# Patient Record
Sex: Male | Born: 1935 | Race: White | Hispanic: No | Marital: Married | Smoking: Former smoker
Health system: Southern US, Community
[De-identification: ages and names within clinical notes are randomized; demographics above are authoritative.]

## PROBLEM LIST (undated history)

## (undated) DIAGNOSIS — E7800 Pure hypercholesterolemia, unspecified: Secondary | ICD-10-CM

## (undated) DIAGNOSIS — E039 Hypothyroidism, unspecified: Secondary | ICD-10-CM

## (undated) DIAGNOSIS — I2510 Atherosclerotic heart disease of native coronary artery without angina pectoris: Secondary | ICD-10-CM

## (undated) DIAGNOSIS — N400 Enlarged prostate without lower urinary tract symptoms: Secondary | ICD-10-CM

## (undated) DIAGNOSIS — I10 Essential (primary) hypertension: Secondary | ICD-10-CM

## (undated) HISTORY — PX: CORONARY ANGIOPLASTY: SHX604

## (undated) HISTORY — PX: CHOLECYSTECTOMY: SHX55

---

## 2004-01-02 ENCOUNTER — Ambulatory Visit: Payer: Self-pay | Admitting: Unknown Physician Specialty

## 2006-04-27 ENCOUNTER — Ambulatory Visit: Payer: Self-pay | Admitting: Internal Medicine

## 2006-05-24 ENCOUNTER — Ambulatory Visit: Payer: Self-pay | Admitting: Internal Medicine

## 2008-06-12 ENCOUNTER — Ambulatory Visit: Payer: Self-pay | Admitting: Internal Medicine

## 2008-09-03 ENCOUNTER — Ambulatory Visit: Payer: Self-pay | Admitting: Unknown Physician Specialty

## 2009-01-03 IMAGING — US SCREENING ULTRASOUND OF ABDOMINAL AORTA
1 series · 18 of 20 positions shown · non-contrast
Comparison: none

REASON FOR EXAM: bruit
COMMENTS:

PROCEDURE:     US  - US EXAM AAA SCREENING  - April 27, 2006  [DATE]
RESULT:     The aorta is well seen with no dilatation identified to suggest
aneurysm. Maximum AP dimension is 1.53 cm and transverse 1.22 cm.

[Series 1: screening ultrasound of abdominal aorta · 18 of 20 slices shown]
[im 1/20]
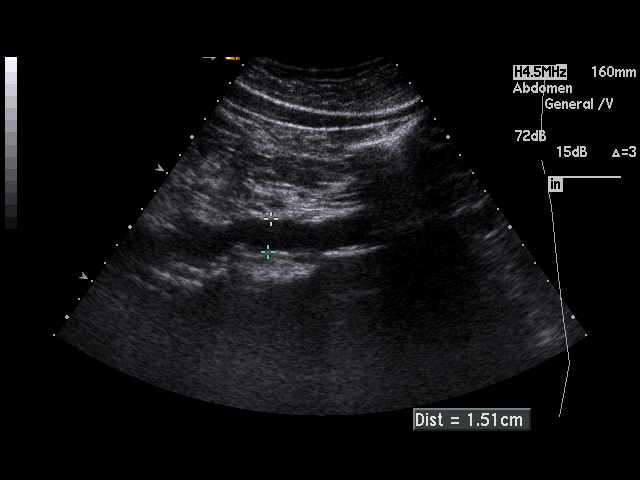
[im 2/20]
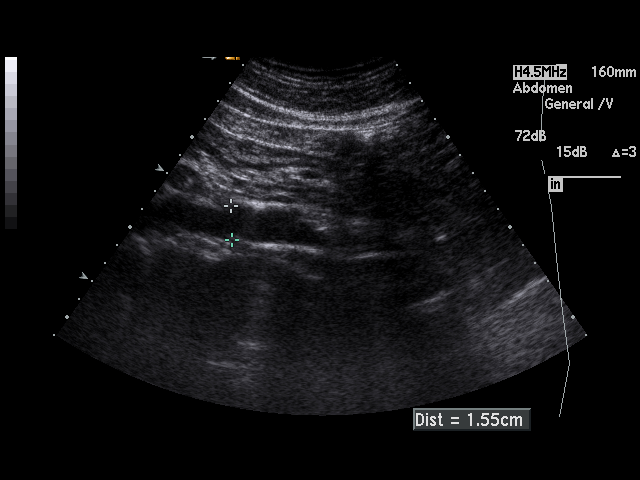
[im 3/20]
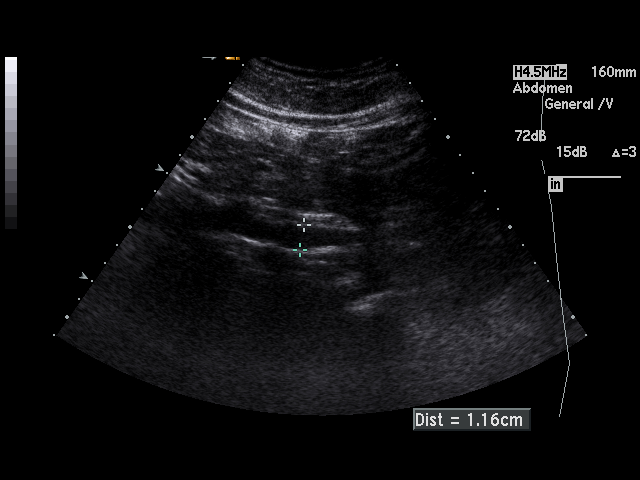
[im 4/20]
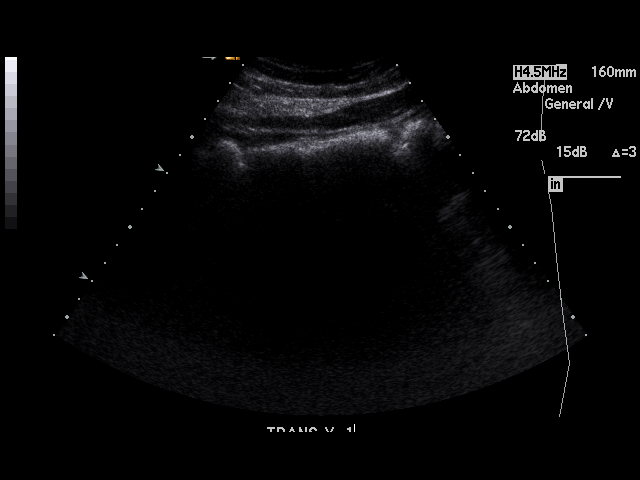
[im 6/20]
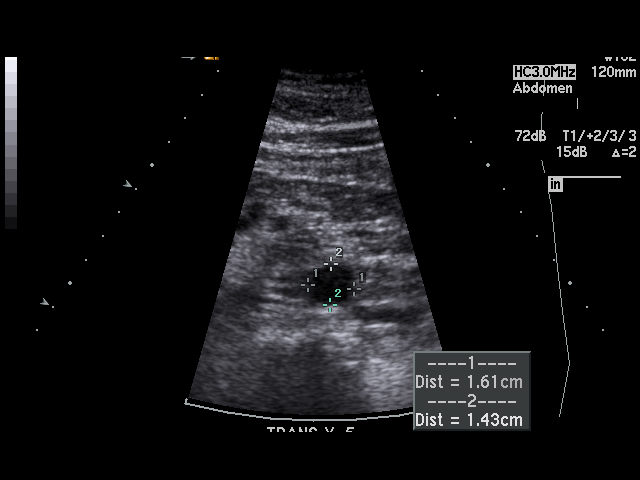
[im 7/20]
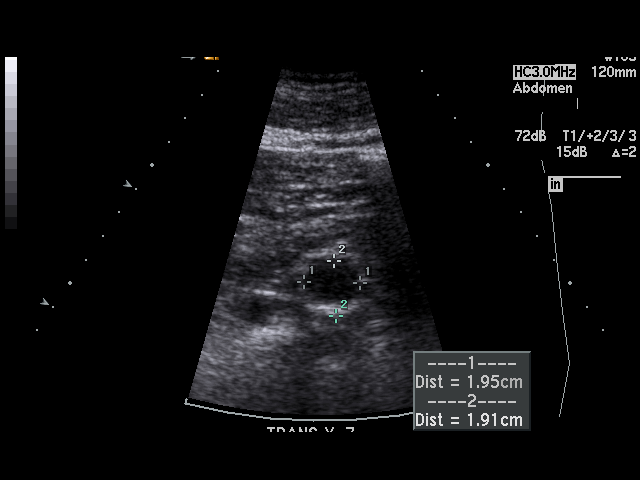
[im 8/20]
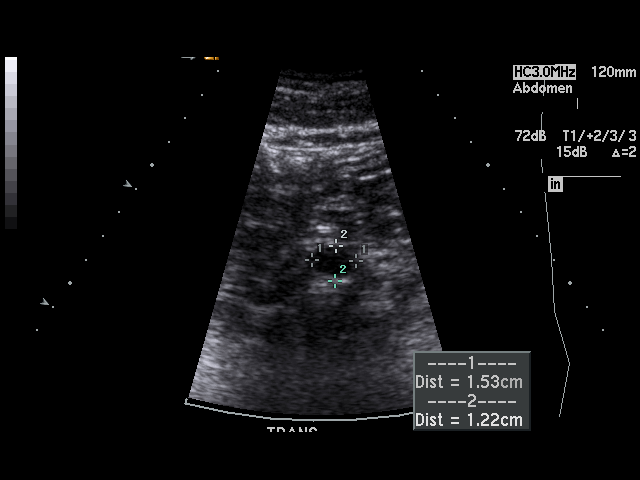
[im 9/20]
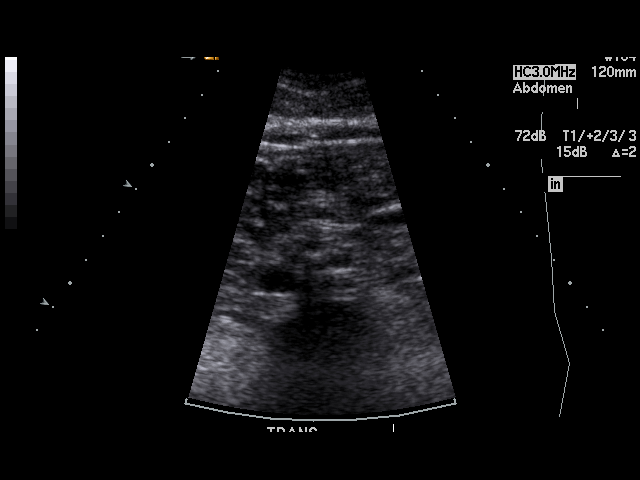
[im 10/20]
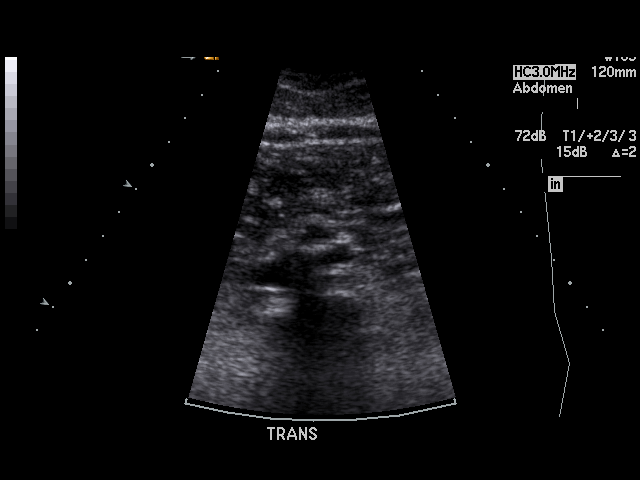
[im 11/20]
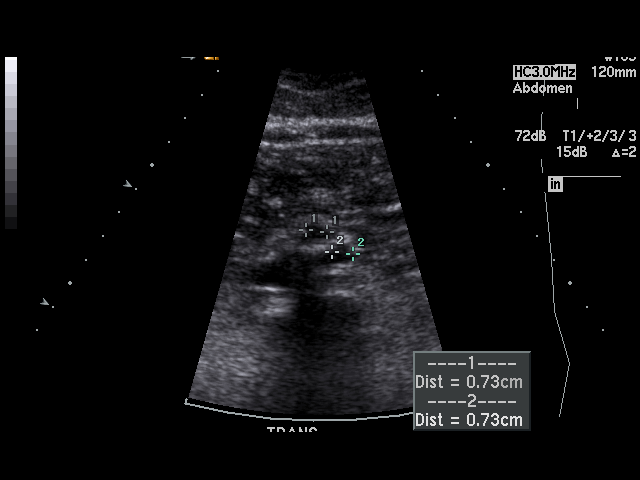
[im 12/20]
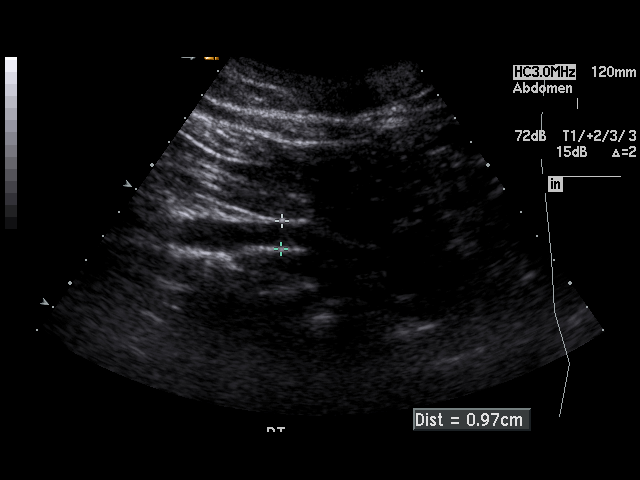
[im 13/20]
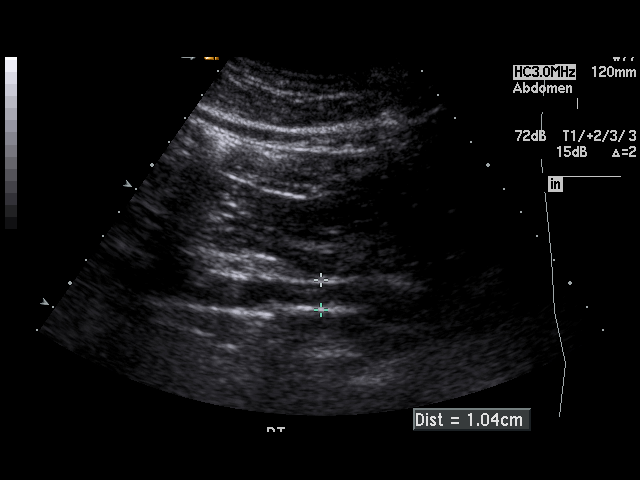
[im 14/20]
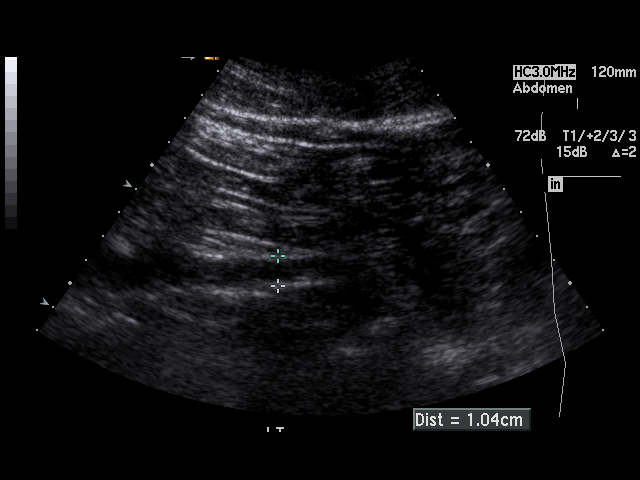
[im 16/20]
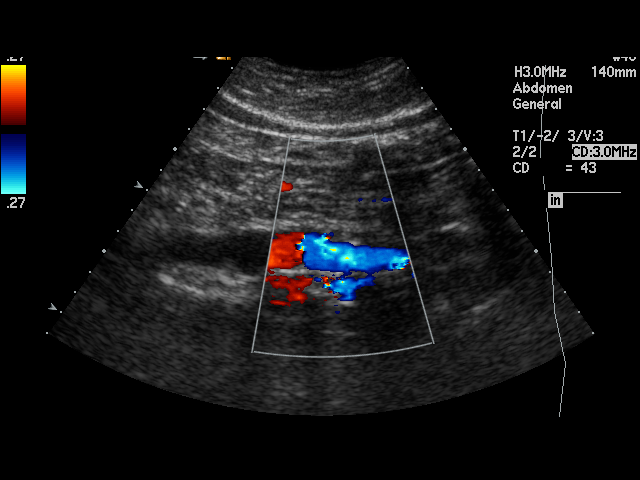
[im 17/20]
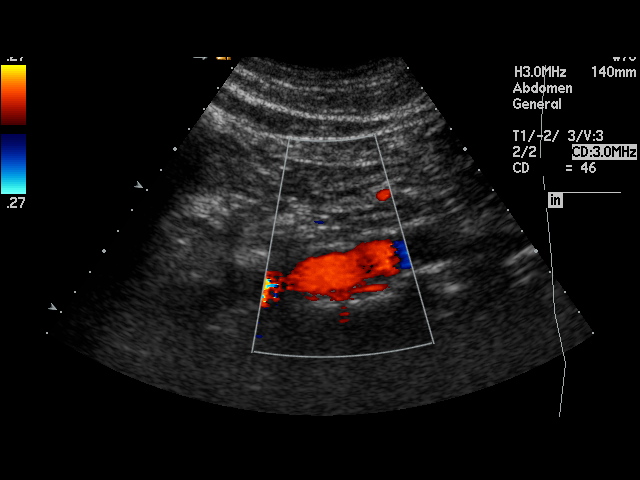
[im 18/20]
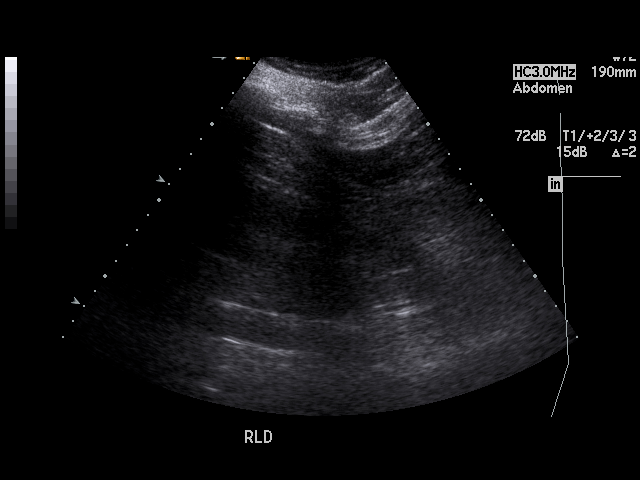
[im 19/20]
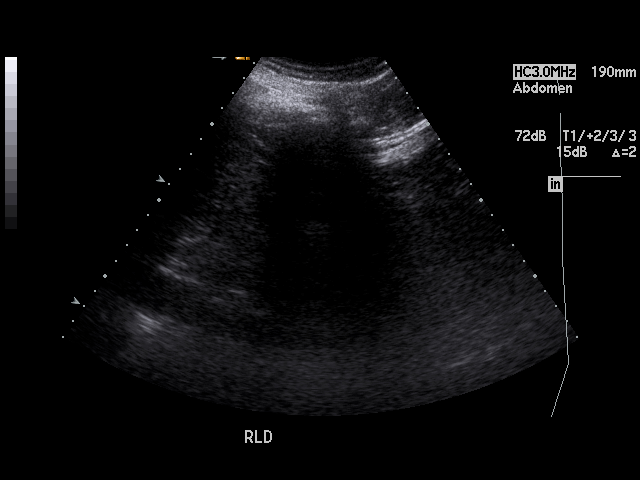
[im 20/20]
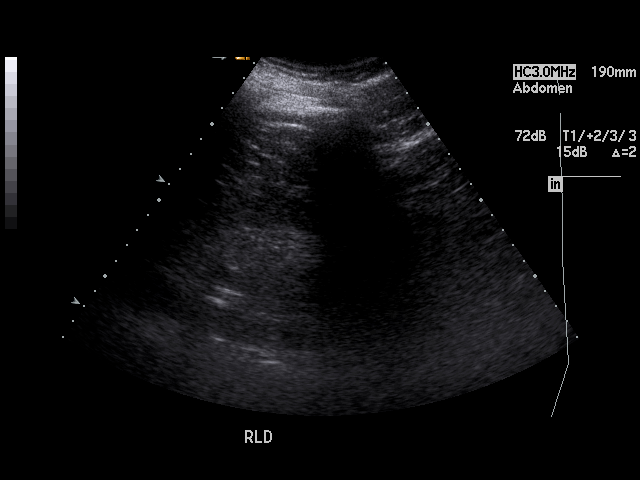

[18 of 20 positions shown; findings below may reference images not displayed]

IMPRESSION: 1)No abdominal aortic aneurysm is identified.

## 2012-01-10 ENCOUNTER — Ambulatory Visit: Payer: Self-pay | Admitting: Unknown Physician Specialty

## 2012-01-11 LAB — PATHOLOGY REPORT

## 2016-04-05 ENCOUNTER — Encounter: Payer: Self-pay | Admitting: *Deleted

## 2016-04-12 ENCOUNTER — Ambulatory Visit: Payer: Medicare Other | Admitting: Anesthesiology

## 2016-04-12 ENCOUNTER — Encounter: Payer: Self-pay | Admitting: *Deleted

## 2016-04-12 ENCOUNTER — Encounter
Admission: RE | Disposition: A | Discharge status: Home / Self Care | Payer: Self-pay | Source: Ambulatory Visit | Attending: Ophthalmology

## 2016-04-12 ENCOUNTER — Ambulatory Visit
Admission: RE | Admit: 2016-04-12 | Discharge: 2016-04-12 | Disposition: A | Discharge status: Home / Self Care | Payer: Medicare Other | Source: Ambulatory Visit | Attending: Ophthalmology | Admitting: Ophthalmology

## 2016-04-12 DIAGNOSIS — I10 Essential (primary) hypertension: Secondary | ICD-10-CM | POA: Insufficient documentation

## 2016-04-12 DIAGNOSIS — Z87891 Personal history of nicotine dependence: Secondary | ICD-10-CM | POA: Insufficient documentation

## 2016-04-12 DIAGNOSIS — Z955 Presence of coronary angioplasty implant and graft: Secondary | ICD-10-CM | POA: Diagnosis not present

## 2016-04-12 DIAGNOSIS — E039 Hypothyroidism, unspecified: Secondary | ICD-10-CM | POA: Diagnosis not present

## 2016-04-12 DIAGNOSIS — H2512 Age-related nuclear cataract, left eye: Secondary | ICD-10-CM | POA: Insufficient documentation

## 2016-04-12 DIAGNOSIS — I2510 Atherosclerotic heart disease of native coronary artery without angina pectoris: Secondary | ICD-10-CM | POA: Insufficient documentation

## 2016-04-12 DIAGNOSIS — Z79899 Other long term (current) drug therapy: Secondary | ICD-10-CM | POA: Insufficient documentation

## 2016-04-12 HISTORY — DX: Essential (primary) hypertension: I10

## 2016-04-12 HISTORY — DX: Benign prostatic hyperplasia without lower urinary tract symptoms: N40.0

## 2016-04-12 HISTORY — PX: CATARACT EXTRACTION W/PHACO: SHX586

## 2016-04-12 HISTORY — DX: Atherosclerotic heart disease of native coronary artery without angina pectoris: I25.10

## 2016-04-12 HISTORY — DX: Hypothyroidism, unspecified: E03.9

## 2016-04-12 SURGERY — PHACOEMULSIFICATION, CATARACT, WITH IOL INSERTION
Anesthesia: Monitor Anesthesia Care | Site: Eye | Laterality: Left | Wound class: Clean

## 2016-04-12 MED ORDER — FENTANYL CITRATE (PF) 100 MCG/2ML IJ SOLN
INTRAMUSCULAR | Status: DC | PRN
  Administered 2016-04-12: 25 ug via INTRAVENOUS
  Administered 2016-04-12 (×2): 12.5 ug via INTRAVENOUS

## 2016-04-12 MED ORDER — NA CHONDROIT SULF-NA HYALURON 40-17 MG/ML IO SOLN
INTRAOCULAR | Status: DC | PRN
  Administered 2016-04-12: 1 mL via INTRAOCULAR

## 2016-04-12 MED ORDER — MOXIFLOXACIN HCL 0.5 % OP SOLN
OPHTHALMIC | Status: AC
  Filled 2016-04-12: qty 3

## 2016-04-12 MED ORDER — MOXIFLOXACIN HCL 0.5 % OP SOLN
OPHTHALMIC | Status: DC | PRN
  Administered 2016-04-12: .2 mL via OPHTHALMIC

## 2016-04-12 MED ORDER — ARMC OPHTHALMIC DILATING DROPS
1.0000 "application " | OPHTHALMIC | Status: AC
  Administered 2016-04-12 (×3): 1 via OPHTHALMIC

## 2016-04-12 MED ORDER — POVIDONE-IODINE 5 % OP SOLN
OPHTHALMIC | Status: DC | PRN
  Administered 2016-04-12: 1 via OPHTHALMIC

## 2016-04-12 MED ORDER — ARMC OPHTHALMIC DILATING DROPS
OPHTHALMIC | Status: AC
  Filled 2016-04-12: qty 0.4

## 2016-04-12 MED ORDER — CARBACHOL 0.01 % IO SOLN
INTRAOCULAR | Status: DC | PRN
  Administered 2016-04-12: .5 mL via INTRAOCULAR

## 2016-04-12 MED ORDER — LIDOCAINE HCL (PF) 4 % IJ SOLN
INTRAOCULAR | Status: DC | PRN
  Administered 2016-04-12: 2 mL via OPHTHALMIC

## 2016-04-12 MED ORDER — EPINEPHRINE PF 1 MG/ML IJ SOLN
INTRAOCULAR | Status: DC | PRN
  Administered 2016-04-12: 1 mL via OPHTHALMIC

## 2016-04-12 MED ORDER — POVIDONE-IODINE 5 % OP SOLN
OPHTHALMIC | Status: AC
  Filled 2016-04-12: qty 30

## 2016-04-12 MED ORDER — MOXIFLOXACIN HCL 0.5 % OP SOLN: 1.0000 [drp] | OPHTHALMIC | Status: DC | PRN

## 2016-04-12 MED ORDER — FENTANYL CITRATE (PF) 100 MCG/2ML IJ SOLN
INTRAMUSCULAR | Status: AC
  Filled 2016-04-12: qty 2

## 2016-04-12 MED ORDER — EPINEPHRINE PF 1 MG/ML IJ SOLN
INTRAMUSCULAR | Status: AC
  Filled 2016-04-12: qty 2

## 2016-04-12 MED ORDER — SODIUM CHLORIDE 0.9 % IV SOLN
INTRAVENOUS | Status: DC
  Administered 2016-04-12: 11:00:00 via INTRAVENOUS

## 2016-04-12 SURGICAL SUPPLY — 23 items
CANNULA ANT/CHMB 27GA (MISCELLANEOUS) ×3 IMPLANT
CUP MEDICINE 2OZ PLAST GRAD ST (MISCELLANEOUS) ×3 IMPLANT
GLOVE BIO SURGEON STRL SZ8 (GLOVE) ×3 IMPLANT
GLOVE BIOGEL M 6.5 STRL (GLOVE) ×3 IMPLANT
GLOVE SURG LX 8.0 MICRO (GLOVE) ×2
GLOVE SURG LX STRL 8.0 MICRO (GLOVE) ×1 IMPLANT
GOWN STRL REUS W/ TWL LRG LVL3 (GOWN DISPOSABLE) ×2 IMPLANT
GOWN STRL REUS W/TWL LRG LVL3 (GOWN DISPOSABLE) ×4
LENS IOL ACRSF IQ TRC 9 22.5 ×1 IMPLANT
LENS IOL ACRYSOF IQ TORIC 22.5 ×2 IMPLANT
LENS IOL IQ TORIC 9 22.5 ×1 IMPLANT
PACK CATARACT (MISCELLANEOUS) ×3 IMPLANT
PACK CATARACT BRASINGTON LX (MISCELLANEOUS) ×3 IMPLANT
PACK EYE AFTER SURG (MISCELLANEOUS) ×3 IMPLANT
SOL BSS BAG (MISCELLANEOUS) ×3
SOL PREP PVP 2OZ (MISCELLANEOUS) ×3
SOLUTION BSS BAG (MISCELLANEOUS) ×1 IMPLANT
SOLUTION PREP PVP 2OZ (MISCELLANEOUS) ×1 IMPLANT
SYR 3ML LL SCALE MARK (SYRINGE) ×3 IMPLANT
SYR 5ML LL (SYRINGE) ×3 IMPLANT
SYR TB 1ML 27GX1/2 LL (SYRINGE) ×3 IMPLANT
WATER STERILE IRR 250ML POUR (IV SOLUTION) ×3 IMPLANT
WIPE NON LINTING 3.25X3.25 (MISCELLANEOUS) ×3 IMPLANT

## 2016-04-12 NOTE — Anesthesia Postprocedure Evaluation (Signed)
Anesthesia Post Note  Patient: Sergio Grimes  Procedure(s) Performed: Procedure(s) (LRB): CATARACT EXTRACTION PHACO AND INTRAOCULAR LENS PLACEMENT (IOC) (Left)  Patient location during evaluation: Short Stay Anesthesia Type: MAC Level of consciousness: awake, awake and alert and oriented Pain management: pain level controlled Vital Signs Assessment: post-procedure vital signs reviewed and stable Respiratory status: spontaneous breathing Cardiovascular status: stable Postop Assessment: no headache Anesthetic complications: no     Last Vitals:  Vitals:   04/12/16 1246 04/12/16 1254  BP: (!) 152/58 (!) 152/58  Pulse: (!) 53 (!) 53  Resp: 17 16  Temp: 36.2 C     Last Pain:  Vitals:   04/12/16 1104  TempSrc: Oral                 Lanora Manis

## 2016-04-12 NOTE — H&P (Signed)
All labs reviewed. Abnormal studies sent to patients PCP when indicated.  Previous H&P reviewed, patient examined, there are NO CHANGES.  Kathrynn Backstrom LOUIS4/10/201812:09 PM

## 2016-04-12 NOTE — Anesthesia Preprocedure Evaluation (Signed)
Anesthesia Evaluation  Patient identified by MRN, date of birth, ID band Patient awake    Reviewed: Allergy & Precautions, NPO status , Patient's Chart, lab work & pertinent test results  History of Anesthesia Complications Negative for: history of anesthetic complications  Airway Mallampati: III  TM Distance: >3 FB Neck ROM: Full    Dental no notable dental hx.    Pulmonary neg sleep apnea, neg COPD, former smoker,    breath sounds clear to auscultation- rhonchi (-) wheezing      Cardiovascular hypertension, Pt. on medications + CAD and + Cardiac Stents (2005)   Rhythm:Regular Rate:Normal - Systolic murmurs and - Diastolic murmurs    Neuro/Psych negative neurological ROS  negative psych ROS   GI/Hepatic negative GI ROS, Neg liver ROS,   Endo/Other  neg diabetesHypothyroidism   Renal/GU negative Renal ROS     Musculoskeletal negative musculoskeletal ROS (+)   Abdominal (+) - obese,   Peds  Hematology negative hematology ROS (+)   Anesthesia Other Findings Past Medical History: No date: BPH (benign prostatic hyperplasia) No date: Coronary artery disease No date: Hypertension No date: Hypothyroidism   Reproductive/Obstetrics                             Anesthesia Physical Anesthesia Plan  ASA: III  Anesthesia Plan: MAC   Post-op Pain Management:    Induction: Intravenous  Airway Management Planned: Natural Airway  Additional Equipment:   Intra-op Plan:   Post-operative Plan:   Informed Consent: I have reviewed the patients History and Physical, chart, labs and discussed the procedure including the risks, benefits and alternatives for the proposed anesthesia with the patient or authorized representative who has indicated his/her understanding and acceptance.     Plan Discussed with: CRNA and Anesthesiologist  Anesthesia Plan Comments:         Anesthesia Quick  Evaluation

## 2016-04-12 NOTE — Op Note (Signed)
PREOPERATIVE DIAGNOSIS:  Nuclear sclerotic cataract of the left eye.   POSTOPERATIVE DIAGNOSIS:  Nuclear sclerotic cataract of the left eye.   OPERATIVE PROCEDURE: Procedure(s): CATARACT EXTRACTION PHACO AND INTRAOCULAR LENS PLACEMENT (IOC)   SURGEON:  Galen Manila, MD.   ANESTHESIA: 1.      Managed anesthesia care. 2.     0.77ml os Shugarcaine was instilled following the paracentesis 2oranesstaff@   COMPLICATIONS:  None.   TECHNIQUE:   Stop and chop    DESCRIPTION OF PROCEDURE:  The patient was examined and consented in the preoperative holding area where the aforementioned topical anesthesia was applied to the left eye.  The patient was brought back to the Operating Room where he was sat upright on the gurney and given a target to fixate upon while the eye was marked at the 3:00 and 9:00 position.  The patient was then reclined on the operating table.  The eye was prepped and draped in the usual sterile ophthalmic fashion and a lid speculum was placed. A paracentesis was created with the side port blade and the anterior chamber was filled with viscoelastic. A near clear corneal incision was performed with the steel keratome. A continuous curvilinear capsulorrhexis was performed with a cystotome followed by the capsulorrhexis forceps. Hydrodissection and hydrodelineation were carried out with BSS on a blunt cannula. The lens was removed in a stop and chop technique and the remaining cortical material was removed with the irrigation-aspiration handpiece. The eye was inflated with viscoelastic and the ZCT lens was placed in the eye and rotated to within a few degrees of the predetermined orientation.  The remaining viscoelastic was removed from the eye.  The Sinskey hook was used to rotate the toric lens into its final resting place at 172 degrees.  0.1 ml of Vigamox was placed in the anterior chamber. The eye was inflated to a physiologic pressure and found to be watertight.  The eye was dressed  with Vigamox. The patient was given protective glasses to wear throughout the day and a shield with which to sleep tonight. The patient was also given drops with which to begin a drop regimen today and will follow-up with me in one day.  Implant Name Type Inv. Item Serial No. Manufacturer Lot No. LRB No. Used  LENS IOL TORIC 22.5 - [id number]   LENS IOL TORIC 22.5 [id number] ALCON   Left 1   Procedure(s) with comments: CATARACT EXTRACTION PHACO AND INTRAOCULAR LENS PLACEMENT (IOC) (Left) - [place] 02:46 AP% 15.7 CDE 26.07 Fluid pack lot # [id number]  Electronically signed: Gladyce Mcray LOUIS 4/10/201812:50 PM

## 2016-04-12 NOTE — Anesthesia Post-op Follow-up Note (Cosign Needed)
Anesthesia QCDR form completed.        

## 2016-04-12 NOTE — Discharge Instructions (Signed)
Eye Surgery Discharge Instructions  Expect mild scratchy sensation or mild soreness. DO NOT RUB YOUR EYE!  The day of surgery:  Minimal physical activity, but bed rest is not required  No reading, computer work, or close hand work  No bending, lifting, or straining.  May watch TV  For 24 hours:  No driving, legal decisions, or alcoholic beverages  Safety precautions  Eat anything you prefer: It is better to start with liquids, then soup then solid foods.  _____ Eye patch should be worn until postoperative exam tomorrow.  ____ Solar shield eyeglasses should be worn for comfort in the sunlight/patch while sleeping  Resume all regular medications including aspirin or Coumadin if these were discontinued prior to surgery. You may shower, bathe, shave, or wash your hair. Tylenol may be taken for mild discomfort.  Call your doctor if you experience significant pain, nausea, or vomiting, fever > 101 or other signs of infection. [phone] or [phone] Specific instructions:  Follow-up Information    PORFILIO,WILLIAM LOUIS, MD Follow up.   Specialty:  Ophthalmology Why:  April 11 at 10:25am Contact information: [address] [place] [address] [phone]

## 2016-04-12 NOTE — Transfer of Care (Signed)
Immediate Anesthesia Transfer of Care Note  Patient: Sergio Grimes  Procedure(s) Performed: Procedure(s) with comments: CATARACT EXTRACTION PHACO AND INTRAOCULAR LENS PLACEMENT (IOC) (Left) - [place] 02:46 AP% 15.7 CDE 26.07 Fluid pack lot # [id number]  Patient Location: PACU and Short Stay  Anesthesia Type:MAC  Level of Consciousness: awake, alert  and oriented  Airway & Oxygen Therapy: Patient Spontanous Breathing  Post-op Assessment: Report given to RN and Post -op Vital signs reviewed and stable  Post vital signs: Reviewed and stable  Last Vitals:  Vitals:   04/12/16 1104 04/12/16 1254  BP: (!) 136/58 (!) 152/58  Pulse: (!) 54 (!) 53  Resp: 18 16  Temp: 36.4 C     Last Pain:  Vitals:   04/12/16 1104  TempSrc: Oral         Complications: No apparent anesthesia complications

## 2016-04-26 ENCOUNTER — Encounter: Payer: Self-pay | Admitting: *Deleted

## 2016-05-03 ENCOUNTER — Encounter: Payer: Self-pay | Admitting: *Deleted

## 2016-05-03 ENCOUNTER — Ambulatory Visit: Payer: Medicare Other | Admitting: Anesthesiology

## 2016-05-03 ENCOUNTER — Encounter
Admission: RE | Disposition: A | Discharge status: Home / Self Care | Payer: Self-pay | Source: Ambulatory Visit | Attending: Ophthalmology

## 2016-05-03 ENCOUNTER — Ambulatory Visit
Admission: RE | Admit: 2016-05-03 | Discharge: 2016-05-03 | Disposition: A | Discharge status: Home / Self Care | Payer: Medicare Other | Source: Ambulatory Visit | Attending: Ophthalmology | Admitting: Ophthalmology

## 2016-05-03 DIAGNOSIS — I2510 Atherosclerotic heart disease of native coronary artery without angina pectoris: Secondary | ICD-10-CM | POA: Insufficient documentation

## 2016-05-03 DIAGNOSIS — H2511 Age-related nuclear cataract, right eye: Secondary | ICD-10-CM | POA: Diagnosis not present

## 2016-05-03 DIAGNOSIS — I10 Essential (primary) hypertension: Secondary | ICD-10-CM | POA: Insufficient documentation

## 2016-05-03 DIAGNOSIS — Z87891 Personal history of nicotine dependence: Secondary | ICD-10-CM | POA: Insufficient documentation

## 2016-05-03 DIAGNOSIS — Z955 Presence of coronary angioplasty implant and graft: Secondary | ICD-10-CM | POA: Diagnosis not present

## 2016-05-03 DIAGNOSIS — Z79899 Other long term (current) drug therapy: Secondary | ICD-10-CM | POA: Diagnosis not present

## 2016-05-03 DIAGNOSIS — E039 Hypothyroidism, unspecified: Secondary | ICD-10-CM | POA: Diagnosis not present

## 2016-05-03 HISTORY — PX: CATARACT EXTRACTION W/PHACO: SHX586

## 2016-05-03 SURGERY — PHACOEMULSIFICATION, CATARACT, WITH IOL INSERTION
Anesthesia: Monitor Anesthesia Care | Site: Eye | Laterality: Right | Wound class: Clean

## 2016-05-03 MED ORDER — MOXIFLOXACIN HCL 0.5 % OP SOLN
OPHTHALMIC | Status: DC | PRN
  Administered 2016-05-03: 0.2 mL via OPHTHALMIC

## 2016-05-03 MED ORDER — LIDOCAINE HCL (PF) 4 % IJ SOLN
INTRAMUSCULAR | Status: AC
  Filled 2016-05-03: qty 5

## 2016-05-03 MED ORDER — NA CHONDROIT SULF-NA HYALURON 40-17 MG/ML IO SOLN
INTRAOCULAR | Status: DC | PRN
  Administered 2016-05-03: 1 mL via INTRAOCULAR

## 2016-05-03 MED ORDER — NA CHONDROIT SULF-NA HYALURON 40-17 MG/ML IO SOLN
INTRAOCULAR | Status: AC
  Filled 2016-05-03: qty 1

## 2016-05-03 MED ORDER — MOXIFLOXACIN HCL 0.5 % OP SOLN
OPHTHALMIC | Status: DC
Start: 2016-05-03 — End: 2016-05-03
  Filled 2016-05-03: qty 3

## 2016-05-03 MED ORDER — EPINEPHRINE PF 1 MG/ML IJ SOLN
INTRAMUSCULAR | Status: AC
  Filled 2016-05-03: qty 2

## 2016-05-03 MED ORDER — CARBACHOL 0.01 % IO SOLN
INTRAOCULAR | Status: DC | PRN
  Administered 2016-05-03: 0.5 mL via INTRAOCULAR

## 2016-05-03 MED ORDER — POVIDONE-IODINE 5 % OP SOLN
OPHTHALMIC | Status: AC
  Filled 2016-05-03: qty 30

## 2016-05-03 MED ORDER — ARMC OPHTHALMIC DILATING DROPS
OPHTHALMIC | Status: AC
  Filled 2016-05-03: qty 0.4

## 2016-05-03 MED ORDER — ARMC OPHTHALMIC DILATING DROPS
1.0000 "application " | OPHTHALMIC | Status: AC
  Administered 2016-05-03 (×3): 1 via OPHTHALMIC

## 2016-05-03 MED ORDER — SODIUM CHLORIDE 0.9 % IV SOLN
INTRAVENOUS | Status: DC
  Administered 2016-05-03: 10:00:00 via INTRAVENOUS

## 2016-05-03 MED ORDER — POVIDONE-IODINE 5 % OP SOLN
OPHTHALMIC | Status: DC | PRN
  Administered 2016-05-03: 1 via OPHTHALMIC

## 2016-05-03 MED ORDER — MIDAZOLAM HCL 2 MG/2ML IJ SOLN
INTRAMUSCULAR | Status: AC
  Filled 2016-05-03: qty 2

## 2016-05-03 MED ORDER — CEFUROXIME OPHTHALMIC INJECTION 1 MG/0.1 ML
INJECTION | OPHTHALMIC | Status: DC | PRN
  Administered 2016-05-03: 1 mg via INTRACAMERAL

## 2016-05-03 MED ORDER — EPINEPHRINE PF 1 MG/ML IJ SOLN
INTRAMUSCULAR | Status: DC | PRN
  Administered 2016-05-03: 11:00:00 via OPHTHALMIC

## 2016-05-03 MED ORDER — MOXIFLOXACIN HCL 0.5 % OP SOLN: 1.0000 [drp] | OPHTHALMIC | Status: DC | PRN

## 2016-05-03 MED ORDER — LIDOCAINE HCL (PF) 4 % IJ SOLN
INTRAMUSCULAR | Status: DC | PRN
  Administered 2016-05-03: 4 mL via OPHTHALMIC

## 2016-05-03 MED ORDER — MIDAZOLAM HCL 2 MG/2ML IJ SOLN
INTRAMUSCULAR | Status: DC | PRN
  Administered 2016-05-03 (×2): .5 mg via INTRAVENOUS
  Administered 2016-05-03: 0.5 mg via INTRAVENOUS
  Administered 2016-05-03: .5 mg via INTRAVENOUS

## 2016-05-03 SURGICAL SUPPLY — 23 items
CANNULA ANT/CHMB 27GA (MISCELLANEOUS) ×3 IMPLANT
CUP MEDICINE 2OZ PLAST GRAD ST (MISCELLANEOUS) ×3 IMPLANT
GLOVE BIO SURGEON STRL SZ8 (GLOVE) ×3 IMPLANT
GLOVE BIOGEL M 6.5 STRL (GLOVE) ×3 IMPLANT
GLOVE SURG LX 8.0 MICRO (GLOVE) ×2
GLOVE SURG LX STRL 8.0 MICRO (GLOVE) ×1 IMPLANT
GOWN STRL REUS W/ TWL LRG LVL3 (GOWN DISPOSABLE) ×2 IMPLANT
GOWN STRL REUS W/TWL LRG LVL3 (GOWN DISPOSABLE) ×4
LENS IOL ACRSF IQ TRC 8 22.5 ×1 IMPLANT
LENS IOL ACRYSOF IQ TORIC 22.5 ×2 IMPLANT
LENS IOL IQ TORIC 8 22.5 ×1 IMPLANT
PACK CATARACT (MISCELLANEOUS) ×3 IMPLANT
PACK CATARACT BRASINGTON LX (MISCELLANEOUS) ×3 IMPLANT
PACK EYE AFTER SURG (MISCELLANEOUS) ×3 IMPLANT
SOL BSS BAG (MISCELLANEOUS) ×3
SOL PREP PVP 2OZ (MISCELLANEOUS) ×3
SOLUTION BSS BAG (MISCELLANEOUS) ×1 IMPLANT
SOLUTION PREP PVP 2OZ (MISCELLANEOUS) ×1 IMPLANT
SYR 3ML LL SCALE MARK (SYRINGE) ×3 IMPLANT
SYR 5ML LL (SYRINGE) ×3 IMPLANT
SYR TB 1ML 27GX1/2 LL (SYRINGE) ×3 IMPLANT
WATER STERILE IRR 250ML POUR (IV SOLUTION) ×3 IMPLANT
WIPE NON LINTING 3.25X3.25 (MISCELLANEOUS) ×3 IMPLANT

## 2016-05-03 NOTE — Transfer of Care (Signed)
Immediate Anesthesia Transfer of Care Note  Patient: Sergio Grimes  Procedure(s) Performed: Procedure(s) with comments: CATARACT EXTRACTION PHACO AND INTRAOCULAR LENS PLACEMENT ([place]) (Right) - [place] 02:47 AP% 21.2 CDE 35.56 Fluid Pack lot # [id number]  Patient Location: PACU  Anesthesia Type:MAC  Level of Consciousness: awake, alert  and oriented  Airway & Oxygen Therapy: Patient Spontanous Breathing and Patient connected to nasal cannula oxygen  Post-op Assessment: Report given to RN and Post -op Vital signs reviewed and stable  Post vital signs: Reviewed and stable  Last Vitals:  Vitals:   05/03/16 0919  BP: (!) 143/65  Pulse: (!) 53  Resp: 18  Temp: (!) 36.1 C    Last Pain:  Vitals:   05/03/16 0919  TempSrc: Tympanic         Complications: No apparent anesthesia complications

## 2016-05-03 NOTE — Anesthesia Post-op Follow-up Note (Cosign Needed)
Anesthesia QCDR form completed.        

## 2016-05-03 NOTE — Anesthesia Preprocedure Evaluation (Signed)
Anesthesia Evaluation  Patient identified by MRN, date of birth, ID band Patient awake    Reviewed: Allergy & Precautions, NPO status , Patient's Chart, lab work & pertinent test results  History of Anesthesia Complications Negative for: history of anesthetic complications  Airway Mallampati: II       Dental   Pulmonary neg pulmonary ROS, former smoker,           Cardiovascular hypertension, Pt. on medications and Pt. on home beta blockers + CAD and + Cardiac Stents       Neuro/Psych negative neurological ROS     GI/Hepatic negative GI ROS, Neg liver ROS,   Endo/Other  Hypothyroidism   Renal/GU negative Renal ROS     Musculoskeletal   Abdominal   Peds  Hematology negative hematology ROS (+)   Anesthesia Other Findings   Reproductive/Obstetrics                             Anesthesia Physical Anesthesia Plan  ASA: III  Anesthesia Plan: MAC   Post-op Pain Management:    Induction: Intravenous  Airway Management Planned: Nasal Cannula  Additional Equipment:   Intra-op Plan:   Post-operative Plan:   Informed Consent: I have reviewed the patients History and Physical, chart, labs and discussed the procedure including the risks, benefits and alternatives for the proposed anesthesia with the patient or authorized representative who has indicated his/her understanding and acceptance.     Plan Discussed with:   Anesthesia Plan Comments:         Anesthesia Quick Evaluation

## 2016-05-03 NOTE — H&P (Signed)
All labs reviewed. Abnormal studies sent to patients PCP when indicated.  Previous H&P reviewed, patient examined, there are NO CHANGES.  Sergio Grimes LOUIS5/1/201810:31 AM

## 2016-05-03 NOTE — Op Note (Signed)
PREOPERATIVE DIAGNOSIS:  Nuclear sclerotic cataract of the right eye.   POSTOPERATIVE DIAGNOSIS:  Nuclear sclerotic cataract of the right eye.   OPERATIVE PROCEDURE: Procedure(s): CATARACT EXTRACTION PHACO AND INTRAOCULAR LENS PLACEMENT (IOC)   SURGEON:  Galen Manila, MD.   ANESTHESIA: 1.      Managed anesthesia care. 2.     0.29ml of Shugarcaine was instilled following the paracentesis  Anesthesiologist: Yves Dill, MD; Naomie Dean, MD CRNA: Edyth Gunnels  COMPLICATIONS:  None.   TECHNIQUE:   Stop and chop    DESCRIPTION OF PROCEDURE:  The patient was examined and consented in the preoperative holding area where the aforementioned topical anesthesia was applied to the right eye.  The patient was brought back to the Operating Room where he was sat upright on the gurney and given a target to fixate upon while the eye was marked at the 3:00 and 9:00 position.  The patient was then reclined on the operating table.  The eye was prepped and draped in the usual sterile ophthalmic fashion and a lid speculum was placed. A paracentesis was created with the side port blade and the anterior chamber was filled with viscoelastic. A near clear corneal incision was performed with the steel keratome. A continuous curvilinear capsulorrhexis was performed with a cystotome followed by the capsulorrhexis forceps. Hydrodissection and hydrodelineation were carried out with BSS on a blunt cannula. The lens was removed in a stop and chop technique and the remaining cortical material was removed with the irrigation-aspiration handpiece. The eye was inflated with viscoelastic and the ZCT  lens  was placed in the eye and rotated to within a few degrees of the predetermined orientation.  The remaining viscoelastic was removed from the eye.  The Sinskey hook was used to rotate the toric lens into its final resting place at 009 degrees.  0. The eye was inflated to a physiologic pressure and found to be watertight.  0.60ml of Vigamox was placed in the anterior chamber.  The eye was dressed with Vigamox. The patient was given protective glasses to wear throughout the day and a shield with which to sleep tonight. The patient was also given drops with which to begin a drop regimen today and will follow-up with me in one day.  Implant Name Type Inv. Item Serial No. Manufacturer Lot No. LRB No. Used  LENS IOL TORIC 22.5 - [id number]   LENS IOL TORIC 22.5 [id number] ALCON   Right 1   Procedure(s) with comments: CATARACT EXTRACTION PHACO AND INTRAOCULAR LENS PLACEMENT (IOC) (Right) - [place] 02:47 AP% 21.2 CDE 35.56 Fluid Pack lot # [id number]  Electronically signed: Joslyn Ramos LOUIS 05/03/2016 11:09 AM

## 2016-05-03 NOTE — Anesthesia Postprocedure Evaluation (Signed)
Anesthesia Post Note  Patient: Sergio Grimes  Procedure(s) Performed: Procedure(s) (LRB): CATARACT EXTRACTION PHACO AND INTRAOCULAR LENS PLACEMENT (IOC) (Right)  Patient location during evaluation: PACU Anesthesia Type: MAC Level of consciousness: awake and alert Pain management: pain level controlled Vital Signs Assessment: post-procedure vital signs reviewed and stable Respiratory status: spontaneous breathing, nonlabored ventilation, respiratory function stable and patient connected to nasal cannula oxygen Cardiovascular status: stable and blood pressure returned to baseline Anesthetic complications: no     Last Vitals:  Vitals:   05/03/16 0919  BP: (!) 143/65  Pulse: (!) 53  Resp: 18  Temp: (!) 36.1 C    Last Pain:  Vitals:   05/03/16 0919  TempSrc: Tympanic                 Caragh Gasper C

## 2016-05-03 NOTE — Discharge Instructions (Signed)
Eye Surgery Discharge Instructions  Expect mild scratchy sensation or mild soreness. DO NOT RUB YOUR EYE!  The day of surgery:  Minimal physical activity, but bed rest is not required  No reading, computer work, or close hand work  No bending, lifting, or straining.  May watch TV  For 24 hours:  No driving, legal decisions, or alcoholic beverages  Safety precautions  Eat anything you prefer: It is better to start with liquids, then soup then solid foods.  _____ Eye patch should be worn until postoperative exam tomorrow.  ____ Solar shield eyeglasses should be worn for comfort in the sunlight/patch while sleeping  Resume all regular medications including aspirin or Coumadin if these were discontinued prior to surgery. You may shower, bathe, shave, or wash your hair. Tylenol may be taken for mild discomfort.  Call your doctor if you experience significant pain, nausea, or vomiting, fever > 101 or other signs of infection. [phone] or [phone] Specific instructions:  Follow-up Information    PORFILIO,WILLIAM LOUIS, MD Follow up on 05/04/2016.   Specialty:  Ophthalmology Why:  [address] information: [address] [place] [address] [phone]

## 2016-05-04 ENCOUNTER — Encounter: Payer: Self-pay | Admitting: Ophthalmology

## 2016-06-20 ENCOUNTER — Encounter: Payer: Self-pay | Admitting: *Deleted

## 2016-06-28 ENCOUNTER — Encounter: Payer: Self-pay | Admitting: *Deleted

## 2016-06-28 ENCOUNTER — Ambulatory Visit: Payer: Medicare Other | Admitting: Anesthesiology

## 2016-06-28 ENCOUNTER — Encounter
Admission: RE | Disposition: A | Discharge status: Home / Self Care | Payer: Self-pay | Source: Ambulatory Visit | Attending: Ophthalmology

## 2016-06-28 ENCOUNTER — Ambulatory Visit
Admission: RE | Admit: 2016-06-28 | Discharge: 2016-06-28 | Disposition: A | Discharge status: Home / Self Care | Payer: Medicare Other | Source: Ambulatory Visit | Attending: Ophthalmology | Admitting: Ophthalmology

## 2016-06-28 DIAGNOSIS — I10 Essential (primary) hypertension: Secondary | ICD-10-CM | POA: Diagnosis not present

## 2016-06-28 DIAGNOSIS — E7800 Pure hypercholesterolemia, unspecified: Secondary | ICD-10-CM | POA: Diagnosis not present

## 2016-06-28 DIAGNOSIS — Z79899 Other long term (current) drug therapy: Secondary | ICD-10-CM | POA: Diagnosis not present

## 2016-06-28 DIAGNOSIS — T8522XA Displacement of intraocular lens, initial encounter: Secondary | ICD-10-CM | POA: Insufficient documentation

## 2016-06-28 DIAGNOSIS — J449 Chronic obstructive pulmonary disease, unspecified: Secondary | ICD-10-CM | POA: Diagnosis not present

## 2016-06-28 DIAGNOSIS — Z87891 Personal history of nicotine dependence: Secondary | ICD-10-CM | POA: Diagnosis not present

## 2016-06-28 DIAGNOSIS — Z955 Presence of coronary angioplasty implant and graft: Secondary | ICD-10-CM | POA: Diagnosis not present

## 2016-06-28 DIAGNOSIS — H52201 Unspecified astigmatism, right eye: Secondary | ICD-10-CM | POA: Diagnosis not present

## 2016-06-28 DIAGNOSIS — Z7982 Long term (current) use of aspirin: Secondary | ICD-10-CM | POA: Insufficient documentation

## 2016-06-28 DIAGNOSIS — E039 Hypothyroidism, unspecified: Secondary | ICD-10-CM | POA: Diagnosis not present

## 2016-06-28 HISTORY — PX: REPOSITION OF LENS: SHX6069

## 2016-06-28 SURGERY — REPOSITIONING, IOL
Anesthesia: Monitor Anesthesia Care | Laterality: Right

## 2016-06-28 MED ORDER — POVIDONE-IODINE 5 % OP SOLN
OPHTHALMIC | Status: DC | PRN
  Administered 2016-06-28: 1 via OPHTHALMIC

## 2016-06-28 MED ORDER — MIDAZOLAM HCL 2 MG/2ML IJ SOLN
INTRAMUSCULAR | Status: DC | PRN
  Administered 2016-06-28 (×2): 1 mg via INTRAVENOUS

## 2016-06-28 MED ORDER — ARMC OPHTHALMIC DILATING DROPS
1.0000 "application " | OPHTHALMIC | Status: AC
  Administered 2016-06-28 (×3): 1 via OPHTHALMIC

## 2016-06-28 MED ORDER — ARMC OPHTHALMIC DILATING DROPS
OPHTHALMIC | Status: AC
  Administered 2016-06-28: 1 via OPHTHALMIC
  Filled 2016-06-28: qty 0.4

## 2016-06-28 MED ORDER — NA CHONDROIT SULF-NA HYALURON 40-17 MG/ML IO SOLN
INTRAOCULAR | Status: DC | PRN
  Administered 2016-06-28: 1 mL via INTRAOCULAR

## 2016-06-28 MED ORDER — SODIUM CHLORIDE 0.9 % IV SOLN
INTRAVENOUS | Status: DC
  Administered 2016-06-28 (×2): via INTRAVENOUS

## 2016-06-28 MED ORDER — EPINEPHRINE PF 1 MG/ML IJ SOLN
INTRAOCULAR | Status: DC | PRN
  Administered 2016-06-28: 1 mL via OPHTHALMIC

## 2016-06-28 MED ORDER — BSS IO SOLN
INTRAOCULAR | Status: DC | PRN
  Administered 2016-06-28: 2 mL via OPHTHALMIC

## 2016-06-28 MED ORDER — MOXIFLOXACIN HCL 0.5 % OP SOLN
OPHTHALMIC | Status: DC | PRN
  Administered 2016-06-28: .2 mL via OPHTHALMIC

## 2016-06-28 MED ORDER — POVIDONE-IODINE 5 % OP SOLN
OPHTHALMIC | Status: AC
  Filled 2016-06-28: qty 30

## 2016-06-28 MED ORDER — MOXIFLOXACIN HCL 0.5 % OP SOLN: 1.0000 [drp] | OPHTHALMIC | Status: DC | PRN

## 2016-06-28 MED ORDER — NA CHONDROIT SULF-NA HYALURON 40-17 MG/ML IO SOLN
INTRAOCULAR | Status: AC
  Filled 2016-06-28: qty 1

## 2016-06-28 MED ORDER — MIDAZOLAM HCL 2 MG/2ML IJ SOLN
INTRAMUSCULAR | Status: AC
  Filled 2016-06-28: qty 2

## 2016-06-28 MED ORDER — MOXIFLOXACIN HCL 0.5 % OP SOLN
OPHTHALMIC | Status: AC
  Filled 2016-06-28: qty 3

## 2016-06-28 MED ORDER — EPINEPHRINE PF 1 MG/ML IJ SOLN
INTRAMUSCULAR | Status: AC
  Filled 2016-06-28: qty 2

## 2016-06-28 MED ORDER — LIDOCAINE HCL (PF) 4 % IJ SOLN
INTRAMUSCULAR | Status: AC
  Filled 2016-06-28: qty 5

## 2016-06-28 SURGICAL SUPPLY — 13 items
GLOVE BIO SURGEON STRL SZ8 (GLOVE) ×2 IMPLANT
GLOVE BIOGEL M 6.5 STRL (GLOVE) ×2 IMPLANT
GLOVE SURG LX 8.0 MICRO (GLOVE) ×1
GLOVE SURG LX STRL 8.0 MICRO (GLOVE) ×1 IMPLANT
GOWN STRL REUS W/ TWL LRG LVL3 (GOWN DISPOSABLE) ×2 IMPLANT
GOWN STRL REUS W/TWL LRG LVL3 (GOWN DISPOSABLE) ×2
PACK CATARACT (MISCELLANEOUS) ×2 IMPLANT
PACK CATARACT BRASINGTON LX (MISCELLANEOUS) ×2 IMPLANT
SOL BSS BAG (MISCELLANEOUS) ×2
SOLUTION BSS BAG (MISCELLANEOUS) ×1 IMPLANT
SYR 5ML LL (SYRINGE) ×2 IMPLANT
WATER STERILE IRR 250ML POUR (IV SOLUTION) ×2 IMPLANT
WIPE NON LINTING 3.25X3.25 (MISCELLANEOUS) ×2 IMPLANT

## 2016-06-28 NOTE — Anesthesia Postprocedure Evaluation (Signed)
Anesthesia Post Note  Patient: Sergio Grimes  Procedure(s) Performed: Procedure(s) (LRB): REPOSITION OF LENS (Right)  Patient location during evaluation: PACU Anesthesia Type: MAC Level of consciousness: awake Pain management: pain level controlled Vital Signs Assessment: post-procedure vital signs reviewed and stable Respiratory status: spontaneous breathing Cardiovascular status: stable Anesthetic complications: no     Last Vitals:  Vitals:   06/28/16 1343 06/28/16 1350  BP: (!) 124/53 (!) 120/54  Pulse: (!) 51 65  Resp: 16 16  Temp: 36.6 C     Last Pain:  Vitals:   06/28/16 1343  TempSrc: Oral                 VAN STAVEREN,Yaresly Menzel

## 2016-06-28 NOTE — Anesthesia Post-op Follow-up Note (Cosign Needed)
Anesthesia QCDR form completed.        

## 2016-06-28 NOTE — H&P (Signed)
All labs reviewed. Abnormal studies sent to patients PCP when indicated.  Previous H&P reviewed, patient examined, there are NO CHANGES.  Sergio Nardozzi LOUIS6/26/20181:40 PM

## 2016-06-28 NOTE — Discharge Instructions (Signed)
Eye Surgery Discharge Instructions  Expect mild scratchy sensation or mild soreness. DO NOT RUB YOUR EYE!  The day of surgery:  Minimal physical activity, but bed rest is not required  No reading, computer work, or close hand work  No bending, lifting, or straining.  May watch TV  For 24 hours:  No driving, legal decisions, or alcoholic beverages  Safety precautions  Eat anything you prefer: It is better to start with liquids, then soup then solid foods.  _____ Eye patch should be worn until postoperative exam tomorrow.  ____ Solar shield eyeglasses should be worn for comfort in the sunlight/patch while sleeping  Resume all regular medications including aspirin or Coumadin if these were discontinued prior to surgery. You may shower, bathe, shave, or wash your hair. Tylenol may be taken for mild discomfort.  Call your doctor if you experience significant pain, nausea, or vomiting, fever > 101 or other signs of infection. [phone] or [phone] Specific instructions:  Follow-up Information    Galen ManilaPorfilio, William, MD Follow up on 06/29/2016.   Specialty:  Ophthalmology Why:  9:20 Contact information: [address] [place] [address] [phone]

## 2016-06-28 NOTE — Transfer of Care (Signed)
Immediate Anesthesia Transfer of Care Note  Patient: Keni A Rauda  Procedure(s) Performed: Procedure(s): REPOSITION OF LENS (Right)  Patient Location: PACU  Anesthesia Type:MAC  Level of Consciousness: awake, alert  and oriented  Airway & Oxygen Therapy: Patient Spontanous Breathing  Post-op Assessment: Report given to RN  Post vital signs: Reviewed and stable  Last Vitals:  Vitals:   06/28/16 1143  BP: 132/66  Pulse: (!) 51  Resp: 16  Temp: 36.5 C    Last Pain:  Vitals:   06/28/16 1143  TempSrc: Oral         Complications: No apparent anesthesia complications

## 2016-06-28 NOTE — Anesthesia Preprocedure Evaluation (Signed)
Anesthesia Evaluation  Patient identified by MRN, date of birth, ID band Patient awake    Reviewed: Allergy & Precautions  Airway Mallampati: II       Dental  (+) Teeth Intact   Pulmonary neg pulmonary ROS, COPD, former smoker,    breath sounds clear to auscultation       Cardiovascular Exercise Tolerance: Good hypertension, Pt. on medications and Pt. on home beta blockers + CAD and + Cardiac Stents   Rhythm:Regular Rate:Normal     Neuro/Psych negative neurological ROS  negative psych ROS   GI/Hepatic negative GI ROS, Neg liver ROS, (+)     (-) substance abuse  ,   Endo/Other  Hypothyroidism   Renal/GU      Musculoskeletal   Abdominal   Peds negative pediatric ROS (+)  Hematology   Anesthesia Other Findings   Reproductive/Obstetrics                             Anesthesia Physical Anesthesia Plan  ASA: II  Anesthesia Plan: MAC   Post-op Pain Management:    Induction: Intravenous  PONV Risk Score and Plan: 0  Airway Management Planned: Natural Airway and Nasal Cannula  Additional Equipment:   Intra-op Plan:   Post-operative Plan:   Informed Consent: I have reviewed the patients History and Physical, chart, labs and discussed the procedure including the risks, benefits and alternatives for the proposed anesthesia with the patient or authorized representative who has indicated his/her understanding and acceptance.     Plan Discussed with: CRNA  Anesthesia Plan Comments:         Anesthesia Quick Evaluation

## 2016-06-28 NOTE — Op Note (Signed)
PREOPERATIVE DIAGNOSIS:  Nuclear sclerotic cataract of the right eye.   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   ANESTHESIA:  Anesthesiologist: Darleene CleaverVan Staveren, Gerrit HeckGijsbertus F, MD CRNA: Junious SilkNoles, Mark, CRNA  1.      Managed anesthesia care. 2.      0.821ml of Shugarcaine was instilled in the eye following the paracentesis.   COMPLICATIONS:  None.   TECHNIQUE:   Stop and chop   DESCRIPTION OF PROCEDURE:  The patient was examined and consented in the preoperative holding area where the aforementioned topical anesthesia was applied to the right eye and then brought back to the Operating Room where the right eye was prepped and draped in the usual sterile ophthalmic fashion and a lid speculum was placed. A paracentesis was created with the side port blade and the anterior chamber was filled with viscoelastic. A near clear corneal incision was performed with the steel keratome. The capsular bag was inflated with viscoelastic. The lens was rotated 17 degrees clockwise to a final position of 11 degrees as tracked by the Verion . The remaining viscoelastic was removed from the eye with the irrigation-aspiration handpiece. The wounds were hydrated. The anterior chamber was flushed with Miostat and the eye was inflated to physiologic pressure. 0.401ml of Vigamox was placed in the anterior chamber. The wounds were found to be water tight. The eye was dressed with Vigamox. The patient was given protective glasses to wear throughout the day and a shield with which to sleep tonight. The patient was also given drops with which to begin a drop regimen today and will follow-up with me in one day. * No implants in log * Procedure(s): REPOSITION OF LENS (Right)  Electronically signed: Khayree Delellis LOUIS 06/28/2016 1:36 PM

## 2016-06-28 NOTE — Anesthesia Procedure Notes (Signed)
Date/Time: 06/28/2016 1:11 PM Performed by: Junious SilkNOLES, Sergio Flatley Pre-anesthesia Checklist: Patient identified, Emergency Drugs available, Suction available, Patient being monitored and Timeout performed Oxygen Delivery Method: Nasal cannula

## 2016-06-28 NOTE — Anesthesia Postprocedure Evaluation (Signed)
Anesthesia Post Note  Patient: Sergio Grimes  Procedure(s) Performed: Procedure(s) (LRB): REPOSITION OF LENS (Right)  Anesthesia Type: MAC     Last Vitals:  Vitals:   06/28/16 1343 06/28/16 1350  BP: (!) 124/53 (!) 120/54  Pulse: (!) 51 65  Resp: 16 16  Temp: 36.6 C     Last Pain:  Vitals:   06/28/16 1343  TempSrc: Oral                 VAN STAVEREN,Devon Pretty

## 2016-06-29 ENCOUNTER — Encounter: Payer: Self-pay | Admitting: Ophthalmology

## 2017-05-26 ENCOUNTER — Other Ambulatory Visit: Payer: Self-pay | Admitting: Internal Medicine

## 2017-05-26 DIAGNOSIS — R300 Dysuria: Secondary | ICD-10-CM

## 2017-05-26 DIAGNOSIS — R310 Gross hematuria: Secondary | ICD-10-CM

## 2017-06-08 ENCOUNTER — Ambulatory Visit
Admission: RE | Admit: 2017-06-08 | Discharge: 2017-06-08 | Disposition: A | Discharge status: Home / Self Care | Payer: Medicare Other | Source: Ambulatory Visit | Attending: Internal Medicine | Admitting: Internal Medicine

## 2017-06-08 DIAGNOSIS — R300 Dysuria: Secondary | ICD-10-CM | POA: Diagnosis present

## 2017-06-08 DIAGNOSIS — K5730 Diverticulosis of large intestine without perforation or abscess without bleeding: Secondary | ICD-10-CM | POA: Diagnosis not present

## 2017-06-08 DIAGNOSIS — N433 Hydrocele, unspecified: Secondary | ICD-10-CM | POA: Insufficient documentation

## 2017-06-08 DIAGNOSIS — R310 Gross hematuria: Secondary | ICD-10-CM | POA: Diagnosis not present

## 2017-07-03 ENCOUNTER — Telehealth: Payer: Self-pay | Admitting: Urology

## 2017-07-03 ENCOUNTER — Encounter: Payer: Self-pay | Admitting: Urology

## 2017-07-03 ENCOUNTER — Ambulatory Visit (INDEPENDENT_AMBULATORY_CARE_PROVIDER_SITE_OTHER): Payer: Medicare Other | Admitting: Urology

## 2017-07-03 VITALS — BP 125/62 | HR 58 | Ht 72.0 in | Wt 177.7 lb

## 2017-07-03 DIAGNOSIS — R300 Dysuria: Secondary | ICD-10-CM

## 2017-07-03 LAB — URINALYSIS, COMPLETE
Bilirubin, UA: NEGATIVE
GLUCOSE, UA: NEGATIVE
Ketones, UA: NEGATIVE
LEUKOCYTES UA: NEGATIVE
Nitrite, UA: NEGATIVE
PH UA: 5.5 (ref 5.0–7.5)
PROTEIN UA: NEGATIVE
RBC, UA: NEGATIVE
Specific Gravity, UA: 1.015 (ref 1.005–1.030)
Urobilinogen, Ur: 0.2 mg/dL (ref 0.2–1.0)

## 2017-07-03 LAB — MICROSCOPIC EXAMINATION
Epithelial Cells (non renal): NONE SEEN /hpf (ref 0–10)
WBC, UA: NONE SEEN /hpf (ref 0–5)

## 2017-07-03 NOTE — Telephone Encounter (Signed)
Pt did not wish to make f/u appt at check, there was no follow up disposition at that time, pt states Dr. Sherron MondayMacdiarmid did not say anything about coming back, states he will discuss this appt with Dr. Graciela HusbandsKlein, will call to schedule another appt if desired/needed - FYI

## 2017-07-03 NOTE — Progress Notes (Signed)
07/03/2017 9:54 AM   Oney A Suit 1928-11-24 [id number]  Referring provider: Lynnea FerrierKlein, Bert J III, MD [address] [facility] [place] [place], [place] [id number]  Chief Complaint  Patient presents with  . Dysuria    HPI: The patient was recently treated for a urinary tract infection.  He may have had some nausea.  He did have blood in his urine.  He increased frequency and dysuria.  A second course of ciprofloxacin normalizes symptoms  His flow was slow to moderate.  He voids every 2 or 3 hours.  He has not had a bladder infection before.  He denies history of kidney stones previous GU surgery.  He has no neurologic issues  He does take blood thinners and daily aspirin.  He smoked many years ago.  He has had some blood in his sperm in the past  Modifying factors: There are no other modifying factors  Associated signs and symptoms: There are no other associated signs and symptoms Aggravating and relieving factors: There are no other aggravating or relieving factors Severity: Moderate Duration: Persistent   PMH: Past Medical History:  Diagnosis Date  . BPH (benign prostatic hyperplasia)   . Coronary artery disease   . Hypertension   . Hypothyroidism     Surgical History: Past Surgical History:  Procedure Laterality Date  . CATARACT EXTRACTION W/PHACO Left 04/12/2016   Procedure: CATARACT EXTRACTION PHACO AND INTRAOCULAR LENS PLACEMENT (IOC);  Surgeon: Galen ManilaWilliam Porfilio, MD;  Location: [facility];  Service: Ophthalmology;  Laterality: Left;  US 02:46 AP% 15.7 CDE 26.07 Fluid pack lot # [id number]  . CATARACT EXTRACTION W/PHACO Right 05/03/2016   Procedure: CATARACT EXTRACTION PHACO AND INTRAOCULAR LENS PLACEMENT (IOC);  Surgeon: Galen ManilaWilliam Porfilio, MD;  Location: [facility];  Service: Ophthalmology;  Laterality: Right;  US 02:47 AP% 21.2 CDE 35.56 Fluid Pack lot # [id number]  . CHOLECYSTECTOMY    . CORONARY ANGIOPLASTY     STENT 2005  . REPOSITION OF LENS Right 06/28/2016    Procedure: REPOSITION OF LENS;  Surgeon: Galen ManilaPorfilio, William, MD;  Location: [facility];  Service: Ophthalmology;  Laterality: Right;    Home Medications:  Allergies as of 07/03/2017   No Known Allergies     Medication List        Accurate as of 07/03/17  9:54 AM. Always use your most recent med list.          aspirin EC 81 MG tablet Take 81 mg by mouth at bedtime.   clopidogrel 75 MG tablet Commonly known as:  PLAVIX Take 75 mg by mouth daily at 12 noon.   finasteride 5 MG tablet Commonly known as:  PROSCAR Take 5 mg by mouth every evening.   fluoruracil 0.5 % cream Commonly known as:  CARAC Apply 1 application topically daily as needed (rash).   levothyroxine 88 MCG tablet Commonly known as:  SYNTHROID, LEVOTHROID Take 88 mcg by mouth daily before breakfast.   lisinopril 5 MG tablet Commonly known as:  PRINIVIL,ZESTRIL Take 5 mg by mouth daily at 12 noon.   metoprolol succinate 25 MG 24 hr tablet Commonly known as:  TOPROL-XL Take 25 mg by mouth daily.   multivitamin with minerals Tabs tablet Take 1 tablet by mouth daily at 12 noon.   rosuvastatin 40 MG tablet Commonly known as:  CRESTOR Take 40 mg by mouth at bedtime.   vitamin B-12 500 MCG tablet Commonly known as:  CYANOCOBALAMIN Take 500 mcg by mouth daily.  Allergies: No Known Allergies  Family History: Family History  Problem Relation Age of Onset  . Prostate cancer Neg Hx   . Bladder Cancer Neg Hx   . Kidney cancer Neg Hx     Social History:  reports that he has quit smoking. He has quit using smokeless tobacco. He reports that he does not drink alcohol or use drugs.  ROS: UROLOGY Frequent Urination?: Yes Hard to postpone urination?: Yes Burning/pain with urination?: Yes Get up at night to urinate?: Yes Leakage of urine?: No Urine stream starts and stops?: No Trouble starting stream?: No Do you have to strain to urinate?: No Blood in urine?: Yes Urinary tract infection?:  Yes Sexually transmitted disease?: No Injury to kidneys or bladder?: No Painful intercourse?: No Weak stream?: No Erection problems?: Yes Penile pain?: No  Gastrointestinal Nausea?: No Vomiting?: No Indigestion/heartburn?: No Diarrhea?: No Constipation?: No  Constitutional Fever: No Night sweats?: No Weight loss?: No Fatigue?: No  Skin Skin rash/lesions?: Yes Itching?: No  Eyes Blurred vision?: No Double vision?: No  Ears/Nose/Throat Sore throat?: No Sinus problems?: No  Hematologic/Lymphatic Swollen glands?: No Easy bruising?: Yes  Cardiovascular Leg swelling?: No Chest pain?: No  Respiratory Cough?: No Shortness of breath?: No  Endocrine Excessive thirst?: No  Musculoskeletal Back pain?: No Joint pain?: No  Neurological Headaches?: No Dizziness?: No  Psychologic Depression?: No Anxiety?: No  Physical Exam: BP 125/62 (BP Location: Left Arm, Patient Position: Sitting, Cuff Size: Normal)   Pulse (!) 58   Ht 6' (1.829 m)   Wt 80.6 kg (177 lb 11.2 oz)   BMI 24.10 kg/m   Constitutional:  Alert and oriented, No acute distress. HEENT: Crum AT, moist mucus membranes.  Trachea midline, no masses. Cardiovascular: No clubbing, cyanosis, or edema. Respiratory: Normal respiratory effort, no increased work of breathing. GI: Abdomen is soft, nontender, nondistended, no abdominal masses GU: 50 g benign prostate and right sided hydrocele Skin: No rashes, bruises or suspicious lesions. Lymph: No cervical or inguinal adenopathy. Neurologic: Grossly intact, no focal deficits, moving all 4 extremities. Psychiatric: Normal mood and affect.  Laboratory Data: No results found for: WBC, HGB, HCT, MCV, PLT  No results found for: CREATININE  No results found for: PSA  No results found for: TESTOSTERONE  No results found for: HGBA1C  Urinalysis No results found for: COLORURINE, APPEARANCEUR, LABSPEC, PHURINE, GLUCOSEU, HGBUR, BILIRUBINUR, KETONESUR,  PROTEINUR, UROBILINOGEN, NITRITE, LEUKOCYTESUR  Pertinent Imaging: As above  Assessment & Plan: The patient clinically had a bladder infection.  He has no blood in the urine today.  I did send his urine for culture.  The CT scan had no contrast but apparently was $3500.  False negative rates were discussed.  I think it is very reasonable not to repeat it with contrast based upon age and clinical presentation.  Work-up of hematuria was discussed as well as the role of cystoscopy.  He chose watchful waiting.  He no blood in the urine today.  I thought his choice at age 42 was very reasonable.  He understood the differential diagnosis and he was going to speak to his primary care physician again.  If it recurs he will let us know  1. Dysuria  - Urinalysis, Complete   No follow-ups on file.  Martina Sinner, MD   [facility] [address], [address] [place], [place] [address] [phone] u

## 2019-09-25 ENCOUNTER — Emergency Department
Admission: EM | Admit: 2019-09-25 | Discharge: 2019-09-25 | Disposition: A | Discharge status: Home / Self Care | Payer: Medicare Other | Attending: Emergency Medicine | Admitting: Emergency Medicine

## 2019-09-25 ENCOUNTER — Other Ambulatory Visit: Payer: Self-pay

## 2019-09-25 ENCOUNTER — Emergency Department: Payer: Medicare Other

## 2019-09-25 DIAGNOSIS — Z955 Presence of coronary angioplasty implant and graft: Secondary | ICD-10-CM | POA: Insufficient documentation

## 2019-09-25 DIAGNOSIS — I214 Non-ST elevation (NSTEMI) myocardial infarction: Secondary | ICD-10-CM | POA: Diagnosis not present

## 2019-09-25 DIAGNOSIS — Z7982 Long term (current) use of aspirin: Secondary | ICD-10-CM | POA: Insufficient documentation

## 2019-09-25 DIAGNOSIS — I10 Essential (primary) hypertension: Secondary | ICD-10-CM | POA: Insufficient documentation

## 2019-09-25 DIAGNOSIS — I208 Other forms of angina pectoris: Secondary | ICD-10-CM | POA: Insufficient documentation

## 2019-09-25 DIAGNOSIS — Z87891 Personal history of nicotine dependence: Secondary | ICD-10-CM | POA: Insufficient documentation

## 2019-09-25 DIAGNOSIS — Z79890 Hormone replacement therapy (postmenopausal): Secondary | ICD-10-CM | POA: Diagnosis not present

## 2019-09-25 DIAGNOSIS — R0789 Other chest pain: Secondary | ICD-10-CM | POA: Diagnosis present

## 2019-09-25 DIAGNOSIS — Z79899 Other long term (current) drug therapy: Secondary | ICD-10-CM | POA: Insufficient documentation

## 2019-09-25 DIAGNOSIS — I249 Acute ischemic heart disease, unspecified: Secondary | ICD-10-CM | POA: Diagnosis not present

## 2019-09-25 DIAGNOSIS — E039 Hypothyroidism, unspecified: Secondary | ICD-10-CM | POA: Diagnosis not present

## 2019-09-25 LAB — BASIC METABOLIC PANEL
Anion gap: 9 (ref 5–15)
BUN: 31 mg/dL — ABNORMAL HIGH (ref 8–23)
CO2: 24 mmol/L (ref 22–32)
Calcium: 9 mg/dL (ref 8.9–10.3)
Chloride: 105 mmol/L (ref 98–111)
Creatinine, Ser: 1.65 mg/dL — ABNORMAL HIGH (ref 0.61–1.24)
GFR calc Af Amer: 42 mL/min — ABNORMAL LOW (ref >60)
GFR calc non Af Amer: 36 mL/min — ABNORMAL LOW (ref >60)
Glucose, Bld: 101 mg/dL — ABNORMAL HIGH (ref 70–99)
Potassium: 4.7 mmol/L (ref 3.5–5.1)
Sodium: 138 mmol/L (ref 135–145)

## 2019-09-25 LAB — CBC
HCT: 34.1 % — ABNORMAL LOW (ref 39.0–52.0)
Hemoglobin: 11 g/dL — ABNORMAL LOW (ref 13.0–17.0)
MCH: 29.8 pg (ref 26.0–34.0)
MCHC: 32.3 g/dL (ref 30.0–36.0)
MCV: 92.4 fL (ref 80.0–100.0)
Platelets: 155 10*3/uL (ref 150–400)
RBC: 3.69 MIL/uL — ABNORMAL LOW (ref 4.22–5.81)
RDW: 13.6 % (ref 11.5–15.5)
WBC: 7 10*3/uL (ref 4.0–10.5)
nRBC: 0 % (ref 0.0–0.2)

## 2019-09-25 LAB — PROTIME-INR
INR: 1 (ref 0.8–1.2)
Prothrombin Time: 12.8 seconds (ref 11.4–15.2)

## 2019-09-25 LAB — TROPONIN I (HIGH SENSITIVITY)
Troponin I (High Sensitivity): 7 ng/L (ref <18)
Troponin I (High Sensitivity): 7 ng/L (ref <18)

## 2019-09-25 MED ORDER — ISOSORBIDE MONONITRATE ER 30 MG PO TB24: 30.0000 mg | ORAL_TABLET | Freq: Every day | ORAL | 0 refills | Status: DC

## 2019-09-25 NOTE — ED Notes (Signed)
E-signature not working at this time. Pt verbalized understanding of D/C instructions, prescriptions and follow up care with no further questions at this time. Pt in NAD and ambulatory at time of D/C.  

## 2019-09-25 NOTE — ED Provider Notes (Signed)
[facility] Emergency Department Provider Note   ____________________________________________    I have reviewed the triage vital signs and the nursing notes.   HISTORY  Chief Complaint Chest Pain     HPI Sergio Grimes is a 86 y.o. male who presents with complaints of chest discomfort.  Patient reports a history of coronary artery disease, had stent placed 16 years ago, does see Dr. Darrold Junker but has not seen him in a couple of years.  Reports chest tightness with exertion, especially after ambulating approximately 25 yards, pain does improve with rest.  No fevers chills or cough.  No calf pain or swelling.  No pleurisy.  Feels well while at rest.  Past Medical History:  Diagnosis Date  . BPH (benign prostatic hyperplasia)   . Coronary artery disease   . Hypertension   . Hypothyroidism     There are no problems to display for this patient.   Past Surgical History:  Procedure Laterality Date  . CATARACT EXTRACTION W/PHACO Left 04/12/2016   Procedure: CATARACT EXTRACTION PHACO AND INTRAOCULAR LENS PLACEMENT (IOC);  Surgeon: Galen Manila, MD;  Location: [facility];  Service: Ophthalmology;  Laterality: Left;  [place] 02:46 AP% 15.7 CDE 26.07 Fluid pack lot # [id number]  . CATARACT EXTRACTION W/PHACO Right 05/03/2016   Procedure: CATARACT EXTRACTION PHACO AND INTRAOCULAR LENS PLACEMENT (IOC);  Surgeon: Galen Manila, MD;  Location: [facility];  Service: Ophthalmology;  Laterality: Right;  [place] 02:47 AP% 21.2 CDE 35.56 Fluid Pack lot # [id number]  . CHOLECYSTECTOMY    . CORONARY ANGIOPLASTY     STENT 2005  . REPOSITION OF LENS Right 06/28/2016   Procedure: REPOSITION OF LENS;  Surgeon: Galen Manila, MD;  Location: [facility] OR[facility];  Service: Ophthalmology;  Laterality: Right;    Prior to Admission medications   Medication Sig Start Date End Date Taking? Authorizing Provider  aspirin EC 81 MG tablet Take 81 mg by mouth at bedtime.    [provider]  clopidogrel (PLAVIX) 75 MG tablet Take 75 mg by mouth daily at 12 noon.     [provider]  finasteride (PROSCAR) 5 MG tablet Take 5 mg by mouth every evening.     [provider]  fluoruracil (CARAC) 0.5 % cream Apply 1 application topically daily as needed (rash).    [provider]  isosorbide mononitrate (IMDUR) 30 MG 24 hr tablet Take 1 tablet (30 mg total) by mouth daily. 09/25/19 10/25/19  Jene Every, MD  levothyroxine (SYNTHROID, LEVOTHROID) 88 MCG tablet Take 88 mcg by mouth daily before breakfast.    [provider]  lisinopril (PRINIVIL,ZESTRIL) 5 MG tablet Take 5 mg by mouth daily at 12 noon.     [provider]  metoprolol succinate (TOPROL-XL) 25 MG 24 hr tablet Take 25 mg by mouth daily.    [provider]  Multiple Vitamin (MULTIVITAMIN WITH MINERALS) TABS tablet Take 1 tablet by mouth daily at 12 noon.    [provider]  rosuvastatin (CRESTOR) 40 MG tablet Take 40 mg by mouth at bedtime.     [provider]  vitamin B-12 (CYANOCOBALAMIN) 500 MCG tablet Take 500 mcg by mouth daily.    [provider]     Allergies Patient has no known allergies.  Family History  Problem Relation Age of Onset  . Prostate cancer Neg Hx   . Bladder Cancer Neg Hx   . Kidney cancer Neg Hx     Social History Social  History   Tobacco Use  . Smoking status: Former Games developer  . Smokeless tobacco: Former Clinical biochemist  . Vaping Use: Never used  Substance Use Topics  . Alcohol use: No  . Drug use: No    Review of Systems  Constitutional: No fever/chills Eyes: No visual changes.  ENT: No sore throat. Cardiovascular: As above Respiratory: Denies shortness of breath. Gastrointestinal: No abdominal pain.  Genitourinary: Negative for dysuria. Musculoskeletal: Negative for back pain. Skin: Negative for rash. Neurological: Negative for  headaches   ____________________________________________   PHYSICAL EXAM:  VITAL SIGNS: ED Triage Vitals  Enc Vitals Group     BP 09/25/19 1110 (!) 155/56     Pulse Rate 09/25/19 1110 (!) 52     Resp 09/25/19 1110 16     Temp 09/25/19 1110 98 F (36.7 C)     Temp Source 09/25/19 1110 Oral     SpO2 09/25/19 1110 100 %     Weight 09/25/19 1110 80.7 kg (178 lb)     Height 09/25/19 1110 1.778 m (5\' 10" )     Head Circumference --      Peak Flow --      Pain Score 09/25/19 1118 0     Pain Loc --      Pain Edu? --      Excl. in GC? --     Constitutional: Alert and oriented. No acute distress.   Nose: No congestion/rhinnorhea. Mouth/Throat: Mucous membranes are moist.    Cardiovascular: Normal rate, regular rhythm. Grossly normal heart sounds.  Good peripheral circulation. Respiratory: Normal respiratory effort.  No retractions. Lungs CTAB. Gastrointestinal: Soft and nontender. No distention.  No CVA tenderness. Genitourinary: deferred Musculoskeletal: No lower extremity tenderness nor edema.  Warm and well perfused Neurologic:  Normal speech and language. No gross focal neurologic deficits are appreciated.  Skin:  Skin is warm, dry and intact. No rash noted. Psychiatric: Mood and affect are normal. Speech and behavior are normal.  ____________________________________________   LABS (all labs ordered are listed, but only abnormal results are displayed)  Labs Reviewed  BASIC METABOLIC PANEL - Abnormal; Notable for the following components:      Result Value   Glucose, Bld 101 (*)    BUN 31 (*)    Creatinine, Ser 1.65 (*)    GFR calc non Af Amer 36 (*)    GFR calc Af Amer 42 (*)    All other components within normal limits  CBC - Abnormal; Notable for the following components:   RBC 3.69 (*)    Hemoglobin 11.0 (*)    HCT 34.1 (*)    All other components within normal limits  PROTIME-INR  TROPONIN I (HIGH SENSITIVITY)  TROPONIN I (HIGH SENSITIVITY)    ____________________________________________  EKG  ED ECG REPORT I, 09/27/19, the attending physician, personally viewed and interpreted this ECG.  Date: 09/25/2019  Rhythm: Sinus bradycardia QRS Axis: normal Intervals: normal ST/T Wave abnormalities: normal Narrative Interpretation: Occasional PVCs  ____________________________________________  RADIOLOGY  Chest x-ray reviewed by me, no infiltrate effusion or pneumothorax ____________________________________________   PROCEDURES  Procedure(s) performed: No  Procedures   Critical Care performed: No ____________________________________________   INITIAL IMPRESSION / ASSESSMENT AND PLAN / ED COURSE  Pertinent labs & imaging results that were available during my care of the patient were reviewed by me and considered in my medical decision making (see chart for details).  Patient presents with complaints of exertional chest discomfort most consistent with angina.  No pain at rest, feels well currently.  No shortness of breath.  No pleurisy.  Lab work is overall quite reassuring, normal white blood cell count, delta troponin unremarkable.  Mild elevation of BUN and creatinine consistent with mild dehydration.  Chest x-ray reviewed by me, unremarkable.  Discussed with Dr. Juliann Pares of cardiology, agrees with starting Imdur, close follow-up with patient's cardiologist Dr. Darrold Junker for further evaluation of angina.  Strict return precautions discussed with patient, he agrees with plan.    ____________________________________________   FINAL CLINICAL IMPRESSION(S) / ED DIAGNOSES  Final diagnoses:  Angina of effort [facility])        Note:  This document was prepared using Dragon voice recognition software and may include unintentional dictation errors.   Jene Every, MD 09/25/19 [phone]

## 2019-09-25 NOTE — ED Notes (Signed)
Rainbow sent to lab

## 2019-09-25 NOTE — ED Triage Notes (Signed)
Pt arrives to ed with c/o CP and SOB with exertion. Pt states he has to stop activity with very little exertion due tightness in chest. Stent placed around 15 years ago. Denies any radiation. NAD noted at this time

## 2019-10-16 ENCOUNTER — Encounter: Payer: Self-pay | Admitting: Intensive Care

## 2019-10-16 ENCOUNTER — Other Ambulatory Visit
Admission: RE | Admit: 2019-10-16 | Discharge: 2019-10-16 | Disposition: A | Discharge status: Home / Self Care | Payer: Medicare Other | Source: Ambulatory Visit | Attending: Physician Assistant | Admitting: Physician Assistant

## 2019-10-16 ENCOUNTER — Inpatient Hospital Stay
Admission: EM | Admit: 2019-10-16 | Discharge: 2019-10-18 | DRG: 247 | Disposition: A | Discharge status: Home / Self Care | Payer: Medicare Other | Source: Ambulatory Visit | Attending: Hospitalist | Admitting: Hospitalist

## 2019-10-16 ENCOUNTER — Emergency Department: Payer: Medicare Other

## 2019-10-16 ENCOUNTER — Other Ambulatory Visit: Payer: Self-pay

## 2019-10-16 DIAGNOSIS — Y840 Cardiac catheterization as the cause of abnormal reaction of the patient, or of later complication, without mention of misadventure at the time of the procedure: Secondary | ICD-10-CM | POA: Diagnosis not present

## 2019-10-16 DIAGNOSIS — I208 Other forms of angina pectoris: Secondary | ICD-10-CM | POA: Diagnosis not present

## 2019-10-16 DIAGNOSIS — N400 Enlarged prostate without lower urinary tract symptoms: Secondary | ICD-10-CM | POA: Diagnosis present

## 2019-10-16 DIAGNOSIS — Z955 Presence of coronary angioplasty implant and graft: Secondary | ICD-10-CM

## 2019-10-16 DIAGNOSIS — I10 Essential (primary) hypertension: Secondary | ICD-10-CM | POA: Diagnosis present

## 2019-10-16 DIAGNOSIS — I2510 Atherosclerotic heart disease of native coronary artery without angina pectoris: Secondary | ICD-10-CM | POA: Diagnosis present

## 2019-10-16 DIAGNOSIS — Z20822 Contact with and (suspected) exposure to covid-19: Secondary | ICD-10-CM | POA: Diagnosis present

## 2019-10-16 DIAGNOSIS — L7632 Postprocedural hematoma of skin and subcutaneous tissue following other procedure: Secondary | ICD-10-CM | POA: Diagnosis not present

## 2019-10-16 DIAGNOSIS — E039 Hypothyroidism, unspecified: Secondary | ICD-10-CM | POA: Diagnosis present

## 2019-10-16 DIAGNOSIS — Z79899 Other long term (current) drug therapy: Secondary | ICD-10-CM | POA: Diagnosis not present

## 2019-10-16 DIAGNOSIS — Z7902 Long term (current) use of antithrombotics/antiplatelets: Secondary | ICD-10-CM

## 2019-10-16 DIAGNOSIS — Z79890 Hormone replacement therapy (postmenopausal): Secondary | ICD-10-CM | POA: Diagnosis not present

## 2019-10-16 DIAGNOSIS — E785 Hyperlipidemia, unspecified: Secondary | ICD-10-CM | POA: Diagnosis present

## 2019-10-16 DIAGNOSIS — I25110 Atherosclerotic heart disease of native coronary artery with unstable angina pectoris: Secondary | ICD-10-CM | POA: Diagnosis not present

## 2019-10-16 DIAGNOSIS — Z7982 Long term (current) use of aspirin: Secondary | ICD-10-CM

## 2019-10-16 DIAGNOSIS — I214 Non-ST elevation (NSTEMI) myocardial infarction: Principal | ICD-10-CM | POA: Diagnosis present

## 2019-10-16 DIAGNOSIS — I252 Old myocardial infarction: Secondary | ICD-10-CM | POA: Diagnosis not present

## 2019-10-16 DIAGNOSIS — I249 Acute ischemic heart disease, unspecified: Secondary | ICD-10-CM

## 2019-10-16 DIAGNOSIS — E7800 Pure hypercholesterolemia, unspecified: Secondary | ICD-10-CM | POA: Diagnosis present

## 2019-10-16 DIAGNOSIS — R079 Chest pain, unspecified: Secondary | ICD-10-CM | POA: Insufficient documentation

## 2019-10-16 HISTORY — DX: Pure hypercholesterolemia, unspecified: E78.00

## 2019-10-16 LAB — BASIC METABOLIC PANEL
Anion gap: 9 (ref 5–15)
BUN: 26 mg/dL — ABNORMAL HIGH (ref 8–23)
CO2: 25 mmol/L (ref 22–32)
Calcium: 9.1 mg/dL (ref 8.9–10.3)
Chloride: 105 mmol/L (ref 98–111)
Creatinine, Ser: 1.42 mg/dL — ABNORMAL HIGH (ref 0.61–1.24)
GFR, Estimated: 43 mL/min — ABNORMAL LOW (ref >60)
Glucose, Bld: 81 mg/dL (ref 70–99)
Potassium: 4.4 mmol/L (ref 3.5–5.1)
Sodium: 139 mmol/L (ref 135–145)

## 2019-10-16 LAB — CBC
HCT: 36.7 % — ABNORMAL LOW (ref 39.0–52.0)
Hemoglobin: 11.8 g/dL — ABNORMAL LOW (ref 13.0–17.0)
MCH: 29.6 pg (ref 26.0–34.0)
MCHC: 32.2 g/dL (ref 30.0–36.0)
MCV: 92 fL (ref 80.0–100.0)
Platelets: 187 10*3/uL (ref 150–400)
RBC: 3.99 MIL/uL — ABNORMAL LOW (ref 4.22–5.81)
RDW: 13.4 % (ref 11.5–15.5)
WBC: 6.6 10*3/uL (ref 4.0–10.5)
nRBC: 0 % (ref 0.0–0.2)

## 2019-10-16 LAB — TSH: TSH: 5.152 u[IU]/mL — ABNORMAL HIGH (ref 0.350–4.500)

## 2019-10-16 LAB — RESPIRATORY PANEL BY RT PCR (FLU A&B, COVID)
Influenza A by PCR: NEGATIVE
Influenza B by PCR: NEGATIVE
SARS Coronavirus 2 by RT PCR: NEGATIVE

## 2019-10-16 LAB — APTT: aPTT: 29 seconds (ref 24–36)

## 2019-10-16 LAB — TROPONIN I (HIGH SENSITIVITY)
Troponin I (High Sensitivity): 452 ng/L (ref <18)
Troponin I (High Sensitivity): 479 ng/L (ref <18)
Troponin I (High Sensitivity): 445 ng/L (ref <18)

## 2019-10-16 LAB — PROTIME-INR
INR: 1 (ref 0.8–1.2)
Prothrombin Time: 12.6 seconds (ref 11.4–15.2)

## 2019-10-16 MED ORDER — LEVOTHYROXINE SODIUM 88 MCG PO TABS
88.0000 ug | ORAL_TABLET | Freq: Every day | ORAL | Status: DC
  Administered 2019-10-17 – 2019-10-18 (×2): 88 ug via ORAL
  Filled 2019-10-16 (×2): qty 1

## 2019-10-16 MED ORDER — NITROGLYCERIN 0.4 MG SL SUBL: 0.4000 mg | SUBLINGUAL_TABLET | SUBLINGUAL | Status: DC | PRN

## 2019-10-16 MED ORDER — SODIUM CHLORIDE 0.9% FLUSH
3.0000 mL | Freq: Two times a day (BID) | INTRAVENOUS | Status: DC
  Administered 2019-10-16 – 2019-10-18 (×3): 3 mL via INTRAVENOUS

## 2019-10-16 MED ORDER — CLOPIDOGREL BISULFATE 75 MG PO TABS
75.0000 mg | ORAL_TABLET | Freq: Every day | ORAL | Status: DC
  Administered 2019-10-16: 75 mg via ORAL
  Filled 2019-10-16: qty 1

## 2019-10-16 MED ORDER — ASPIRIN 81 MG PO CHEW
324.0000 mg | CHEWABLE_TABLET | Freq: Once | ORAL | Status: AC
  Administered 2019-10-16: 324 mg via ORAL
  Filled 2019-10-16: qty 4

## 2019-10-16 MED ORDER — METOPROLOL SUCCINATE ER 25 MG PO TB24
25.0000 mg | ORAL_TABLET | Freq: Every day | ORAL | Status: DC
  Administered 2019-10-17 – 2019-10-18 (×2): 25 mg via ORAL
  Filled 2019-10-16 (×3): qty 1

## 2019-10-16 MED ORDER — ADULT MULTIVITAMIN W/MINERALS CH
1.0000 | ORAL_TABLET | Freq: Every day | ORAL | Status: DC
  Administered 2019-10-16: 1 via ORAL
  Filled 2019-10-16: qty 1

## 2019-10-16 MED ORDER — ASPIRIN EC 81 MG PO TBEC
81.0000 mg | DELAYED_RELEASE_TABLET | Freq: Every day | ORAL | Status: DC
  Administered 2019-10-16 – 2019-10-17 (×2): 81 mg via ORAL
  Filled 2019-10-16 (×3): qty 1

## 2019-10-16 MED ORDER — SODIUM CHLORIDE 0.9% FLUSH: 3.0000 mL | INTRAVENOUS | Status: DC | PRN

## 2019-10-16 MED ORDER — ISOSORBIDE MONONITRATE ER 30 MG PO TB24
30.0000 mg | ORAL_TABLET | Freq: Every day | ORAL | Status: DC
  Administered 2019-10-16 – 2019-10-17 (×2): 30 mg via ORAL
  Filled 2019-10-16 (×3): qty 1

## 2019-10-16 MED ORDER — HEPARIN BOLUS VIA INFUSION
4000.0000 [IU] | Freq: Once | INTRAVENOUS | Status: AC
  Administered 2019-10-16: 4000 [IU] via INTRAVENOUS
  Filled 2019-10-16: qty 4000

## 2019-10-16 MED ORDER — FINASTERIDE 5 MG PO TABS
5.0000 mg | ORAL_TABLET | Freq: Every evening | ORAL | Status: DC
  Administered 2019-10-17: 5 mg via ORAL
  Filled 2019-10-16 (×2): qty 1

## 2019-10-16 MED ORDER — CYANOCOBALAMIN 500 MCG PO TABS
500.0000 ug | ORAL_TABLET | Freq: Every day | ORAL | Status: DC
  Administered 2019-10-18: 500 ug via ORAL
  Filled 2019-10-16 (×2): qty 1

## 2019-10-16 MED ORDER — ACETAMINOPHEN 325 MG PO TABS: 650.0000 mg | ORAL_TABLET | ORAL | Status: DC | PRN

## 2019-10-16 MED ORDER — SODIUM CHLORIDE 0.9% FLUSH
3.0000 mL | Freq: Two times a day (BID) | INTRAVENOUS | Status: DC
  Administered 2019-10-16: 3 mL via INTRAVENOUS

## 2019-10-16 MED ORDER — ONDANSETRON HCL 4 MG/2ML IJ SOLN: 4.0000 mg | Freq: Four times a day (QID) | INTRAMUSCULAR | Status: DC | PRN

## 2019-10-16 MED ORDER — FLUOROURACIL 0.5 % EX CREA: 1.0000 "application " | TOPICAL_CREAM | Freq: Every day | CUTANEOUS | Status: DC | PRN

## 2019-10-16 MED ORDER — HEPARIN (PORCINE) 25000 UT/250ML-% IV SOLN
850.0000 [IU]/h | INTRAVENOUS | Status: DC
  Administered 2019-10-16: 950 [IU]/h via INTRAVENOUS
  Filled 2019-10-16: qty 250

## 2019-10-16 MED ORDER — ROSUVASTATIN CALCIUM 10 MG PO TABS
40.0000 mg | ORAL_TABLET | Freq: Every day | ORAL | Status: DC
  Administered 2019-10-17: 40 mg via ORAL
  Filled 2019-10-16: qty 2
  Filled 2019-10-16: qty 4

## 2019-10-16 MED ORDER — SODIUM CHLORIDE 0.9 % IV SOLN: 250.0000 mL | INTRAVENOUS | Status: DC | PRN

## 2019-10-16 NOTE — H&P (Signed)
History and Physical    Sergio Grimes DXA:[id number] DOB: December 25, 1928 DOA: 10/16/2019  PCP: Lynnea Ferrier, MD   Patient coming from: Home  I have personally briefly reviewed patient's old medical records in [facility]  Chief Complaint: Chest pain  HPI: Sergio Grimes is a 86 y.o. male with medical history significant for coronary artery disease status post RCA stent in 2005, hypertension, dyslipidemia who was sent to the emergency room from his cardiologist office where he went for evaluation of chest pain.  Patient gives a 1 month history of chest pain with exertion which has continued to worsen.  Patient states that any form of exertion causes chest pain which is relieved at rest.  Patient was started on isosorbide as an outpatient and his dose was increased from 30 mg to 60 with some improvement in his chest pain but not complete resolution. Patient states that overnight he had chest pressure lasting about 2 to 3 hours which prompted his visit to his cardiologist.  He is currently chest pain-free. He denies having any nausea, no vomiting, no diaphoresis, no palpitations or shortness of breath. He had a twelve-lead EKG done in the office which showed nonspecific ST/T wave abnormalities and his initial troponin was elevated at 452. He had a nuclear stress test and echo done about 2 days prior to his admission which showed a normal LV function with anterior apical scar with minimal peri-infarct ischemia. Labs show sodium 139, potassium 4.4, chloride 105, bicarb 25, glucose 81, BUN 26, creatinine 1.42, calcium 9.1, troponin 452 >> 479, white count 6.6, hemoglobin 11.8, hematocrit 36.7, MCV 92, RDW 13.4, platelet count 187 Respiratory viral panel still pending Chest x-ray reviewed by me shows no evidence of an infiltrate or effusion Twelve-lead EKG reviewed by me shows sinus bradycardia with nonspecific ST-T wave abnormalities   ED Course: Patient is an 86 year old male who was  referred to the emergency room by his cardiologist for evaluation of possible non-ST elevation MI.  Patient went to see his cardiologist for evaluation of exertional chest pain relieved at rest which he has had intermittently for over a month but has progressively worsened.  He had a stress test which showed minimal peri-infarct ischemia with a normal LVEF.  Due to persistent chest pain patient was seen again in the office and had troponin levels drawn which came back at 452.  He has some nonspecific EKG changes. He will be admitted to the hospital for further evaluation.   Review of Systems: As per HPI otherwise 10 point review of systems negative.    Past Medical History:  Diagnosis Date  . BPH (benign prostatic hyperplasia)   . Coronary artery disease   . High cholesterol   . Hypertension   . Hypothyroidism     Past Surgical History:  Procedure Laterality Date  . CATARACT EXTRACTION W/PHACO Left 04/12/2016   Procedure: CATARACT EXTRACTION PHACO AND INTRAOCULAR LENS PLACEMENT (IOC);  Surgeon: Galen Manila, MD;  Location: [facility] ORS;  Service: Ophthalmology;  Laterality: Left;  [place] 02:46 AP% 15.7 CDE 26.07 Fluid pack lot # [id number]  . CATARACT EXTRACTION W/PHACO Right 05/03/2016   Procedure: CATARACT EXTRACTION PHACO AND INTRAOCULAR LENS PLACEMENT (IOC);  Surgeon: Galen Manila, MD;  Location: [facility] ORS;  Service: Ophthalmology;  Laterality: Right;  [place] 02:47 AP% 21.2 CDE 35.56 Fluid Pack lot # [id number]  . CHOLECYSTECTOMY    . CORONARY ANGIOPLASTY     STENT 2005  . REPOSITION OF LENS Right 06/28/2016  Procedure: REPOSITION OF LENS;  Surgeon: Galen Manila, MD;  Location: [facility] ORS;  Service: Ophthalmology;  Laterality: Right;     reports that he has quit smoking. He has quit using smokeless tobacco. He reports that he does not drink alcohol and does not use drugs.  No Known Allergies  Family History  Problem Relation Age of Onset  . Prostate cancer Neg Hx   . Bladder  Cancer Neg Hx   . Kidney cancer Neg Hx      Prior to Admission medications   Medication Sig Start Date End Date Taking? Authorizing Provider  aspirin EC 81 MG tablet Take 81 mg by mouth at bedtime.    [provider]  clopidogrel (PLAVIX) 75 MG tablet Take 75 mg by mouth daily at 12 noon.     [provider]  finasteride (PROSCAR) 5 MG tablet Take 5 mg by mouth every evening.     [provider]  fluoruracil (CARAC) 0.5 % cream Apply 1 application topically daily as needed (rash).    [provider]  isosorbide mononitrate (IMDUR) 30 MG 24 hr tablet Take 1 tablet (30 mg total) by mouth daily. 09/25/19 10/25/19  Jene Every, MD  levothyroxine (SYNTHROID, LEVOTHROID) 88 MCG tablet Take 88 mcg by mouth daily before breakfast.    [provider]  lisinopril (PRINIVIL,ZESTRIL) 5 MG tablet Take 5 mg by mouth daily at 12 noon.     [provider]  metoprolol succinate (TOPROL-XL) 25 MG 24 hr tablet Take 25 mg by mouth daily.    [provider]  Multiple Vitamin (MULTIVITAMIN WITH MINERALS) TABS tablet Take 1 tablet by mouth daily at 12 noon.    [provider]  rosuvastatin (CRESTOR) 40 MG tablet Take 40 mg by mouth at bedtime.     [provider]  vitamin B-12 (CYANOCOBALAMIN) 500 MCG tablet Take 500 mcg by mouth daily.    [provider]    Physical Exam: Vitals:   10/16/19 1329 10/16/19 1330 10/16/19 1340  BP:  (!) 119/50 (!) 144/63  Pulse:  64 (!) 57  Resp:  16 16  Temp:  98 F (36.7 C) 98.1 F (36.7 C)  TempSrc:  Oral Oral  SpO2:  99% 100%  Weight: 80.7 kg    Height: 5\' 10"  (1.778 m)       Vitals:   10/16/19 1329 10/16/19 1330 10/16/19 1340  BP:  (!) 119/50 (!) 144/63  Pulse:  64 (!) 57  Resp:  16 16  Temp:  98 F (36.7 C) 98.1 F (36.7 C)  TempSrc:  Oral Oral  SpO2:  99% 100%  Weight: 80.7 kg    Height: 5\' 10"  (1.778 m)      Constitutional: NAD, alert and oriented x 3 Eyes:  PERRL, lids and conjunctivae normal ENMT: Mucous membranes are moist.  Neck: normal, supple, no masses, no thyromegaly Respiratory: clear to auscultation bilaterally, no wheezing, no crackles. Normal respiratory effort. No accessory muscle use.  Cardiovascular: Bradycardia, no murmurs / rubs / gallops. trace extremity edema. 2+ pedal pulses. No carotid bruits.  Abdomen: no tenderness, no masses palpated. No hepatosplenomegaly. Bowel sounds positive.  Musculoskeletal: no clubbing / cyanosis. No joint deformity upper and lower extremities.  Skin: no rashes, lesions, ulcers.  Neurologic: No gross focal neurologic deficit. Psychiatric: Normal mood and affect.   Labs on Admission: I have personally reviewed following labs and imaging studies  CBC: Recent Labs  Lab 10/16/19 1331  WBC 6.6  HGB  11.8*  HCT 36.7*  MCV 92.0  PLT 187   Basic Metabolic Panel: Recent Labs  Lab 10/16/19 1331  NA 139  K 4.4  CL 105  CO2 25  GLUCOSE 81  BUN 26*  CREATININE 1.42*  CALCIUM 9.1   GFR: Estimated Creatinine Clearance: 36.4 mL/min (A) (by C-G formula based on SCr of 1.42 mg/dL (H)). Liver Function Tests: No results for input(s): AST, ALT, ALKPHOS, BILITOT, PROT, ALBUMIN in the last 168 hours. No results for input(s): LIPASE, AMYLASE in the last 168 hours. No results for input(s): AMMONIA in the last 168 hours. Coagulation Profile: Recent Labs  Lab 10/16/19 1331  INR 1.0   Cardiac Enzymes: No results for input(s): CKTOTAL, CKMB, CKMBINDEX, TROPONINI in the last 168 hours. BNP (last 3 results) No results for input(s): PROBNP in the last 8760 hours. HbA1C: No results for input(s): HGBA1C in the last 72 hours. CBG: No results for input(s): GLUCAP in the last 168 hours. Lipid Profile: No results for input(s): CHOL, HDL, LDLCALC, TRIG, CHOLHDL, LDLDIRECT in the last 72 hours. Thyroid Function Tests: Recent Labs    10/16/19 1155  TSH 5.152*   Anemia Panel: No results for input(s):  VITAMINB12, FOLATE, FERRITIN, TIBC, IRON, RETICCTPCT in the last 72 hours. Urine analysis:    Component Value Date/Time   APPEARANCEUR Clear 07/03/2017 0905   GLUCOSEU Negative 07/03/2017 0905   BILIRUBINUR Negative 07/03/2017 0905   PROTEINUR Negative 07/03/2017 0905   NITRITE Negative 07/03/2017 0905   LEUKOCYTESUR Negative 07/03/2017 0905    Radiological Exams on Admission: DG Chest 2 View  Result Date: 10/16/2019 CLINICAL DATA:  Chest pain EXAM: CHEST - 2 VIEW COMPARISON:  09/25/2019 FINDINGS: No new consolidation or edema. No pleural effusion or pneumothorax. Stable cardiomediastinal contours with normal heart size. No acute osseous abnormality. IMPRESSION: No acute process in the chest. Electronically Signed   By: Guadlupe Spanish M.D.   On: 10/16/2019 14:09    EKG: Independently reviewed.  Sinus rhythm Nonspecific ST-T wave changes  Assessment/Plan Principal Problem:   NSTEMI (non-ST elevated myocardial infarction) (HCC) Active Problems:   Coronary artery disease   Hypertension   Hypothyroidism     Non-ST elevation MI In a patient with known coronary artery disease status post RCA stent Patient with complaints of exertional chest pain with a bump in his troponin We will place patient on heparin drip per protocol Continue nitrates, aspirin, statins, Plavix and low-dose beta-blocker Patient has been seen by cardiology and coronary angiogram planned for a.m.   Hypothyroidism Continue Synthroid   BPH Continue finasteride    DVT prophylaxis: Heparin Code Status: Full code Family Communication: Greater than 50% of time was spent discussing patient's condition and plan of care with him and his wife at the bedside.  All questions and concerns have been addressed.  They verbalize understanding and agree with the plan Disposition Plan: Back to previous home environment Consults called: Cardiology    Hosie Sharman MD [facility]     10/16/2019, 3:44  PM

## 2019-10-16 NOTE — ED Triage Notes (Signed)
Patient c/o central chest pain and tightness around 2 weeks.

## 2019-10-16 NOTE — ED Triage Notes (Signed)
Pt comes via [facility] with c/o CP that started last night. Pt went to [facility] for follow up with stress results and informed staff he was having CP.   EKG performed and Trop collected and resulted at  452.

## 2019-10-16 NOTE — ED Notes (Signed)
Family updated at this time.

## 2019-10-16 NOTE — ED Notes (Signed)
Hourly rounding complete. Pt and family with no questions or requests at this time

## 2019-10-16 NOTE — Consult Note (Signed)
CARDIOLOGY CONSULT NOTE               Patient ID: Sergio Grimes MRN: [id number] DOB/AGE: Oct 22, 1928 86 y.o.  Admit date: 10/16/2019 Referring Physician Agbata, MD Primary Physician Dr Daniel Nones Primary Cardiologist Paraschos, MD Reason for Consultation NSTEMI   HPI: 86 year old male referred for evaluation of NSTEMI. The patient has a history of coronary artery disease, status post stent to RCA in 2005, hypertension and hyperlipidemia, with a 1 month history of exertional chest pain induced by minimal exertion. The patient was seen in the ER 09/25/2019 for these symptoms with nondiagnostic ECG, negative troponin x 2, and was discharged on isosorbide mononitrate 30 mg daily, which was later uptitrated to 60 mg daily due to recurrent chest pain. The patient underwent a Lexiscan Myoview on on 10/14/2019.  Gated scintigraphy revealed LVEF 57%.  SPECT imaging revealed predominant anterior apical scar with minimal peri-infarct ischemia.  2D echocardiogram on 10/14/2019 revealed normal left ventricular function with LVEF greater than 55% with normal wall motion with mild LVH with mild tricuspid regurgitation. He had recurrent chest pain last night described as substernal chest pressure with radiation to bilateral arms, which occurred intermittently for 2-3 hours. He presented today to his cardiologist's office to review results of diagnostic studies, and reported his chest pain that he had last night. A troponin was drawn in the clinic, which was elevated to 452. ECG revealed sinus rhythm with nonspecific ST/T wave abnormalities, and he was escorted to [facility] ER for further management. Repeat troponin was mildly elevated to 479, then 445. He was started on heparin drip. Other notable admission labs were creatinine 1.42, BUN 26, TSH 5.152. Chest xray negative for acute abnormality.   Review of systems complete and found to be negative unless listed above     Past Medical History:  Diagnosis Date    . BPH (benign prostatic hyperplasia)   . Coronary artery disease   . High cholesterol   . Hypertension   . Hypothyroidism     Past Surgical History:  Procedure Laterality Date  . CATARACT EXTRACTION W/PHACO Left 04/12/2016   Procedure: CATARACT EXTRACTION PHACO AND INTRAOCULAR LENS PLACEMENT (IOC);  Surgeon: Galen Manila, MD;  Location: [facility] ORS;  Service: Ophthalmology;  Laterality: Left;  [place] 02:46 AP% 15.7 CDE 26.07 Fluid pack lot # [id number]  . CATARACT EXTRACTION W/PHACO Right 05/03/2016   Procedure: CATARACT EXTRACTION PHACO AND INTRAOCULAR LENS PLACEMENT (IOC);  Surgeon: Galen Manila, MD;  Location: [facility] ORS;  Service: Ophthalmology;  Laterality: Right;  [place] 02:47 AP% 21.2 CDE 35.56 Fluid Pack lot # [id number]  . CHOLECYSTECTOMY    . CORONARY ANGIOPLASTY     STENT 2005  . REPOSITION OF LENS Right 06/28/2016   Procedure: REPOSITION OF LENS;  Surgeon: Galen Manila, MD;  Location: [facility] ORS;  Service: Ophthalmology;  Laterality: Right;    (Not in a hospital admission)  Social History   Socioeconomic History  . Marital status: Married    Spouse name: Not on file  . Number of children: Not on file  . Years of education: Not on file  . Highest education level: Not on file  Occupational History  . Not on file  Tobacco Use  . Smoking status: Former Games developer  . Smokeless tobacco: Former Clinical biochemist  . Vaping Use: Never used  Substance and Sexual Activity  . Alcohol use: No  . Drug use: No  . Sexual activity: Not on file  Other Topics Concern  .  Not on file  Social History Narrative  . Not on file   Social Determinants of Health   Financial Resource Strain:   . Difficulty of Paying Living Expenses: Not on file  Food Insecurity:   . Worried About Programme researcher, broadcasting/film/video in the Last Year: Not on file  . Ran Out of Food in the Last Year: Not on file  Transportation Needs:   . Lack of Transportation (Medical): Not on file  . Lack of Transportation  (Non-Medical): Not on file  Physical Activity:   . Days of Exercise per Week: Not on file  . Minutes of Exercise per Session: Not on file  Stress:   . Feeling of Stress : Not on file  Social Connections:   . Frequency of Communication with Friends and Family: Not on file  . Frequency of Social Gatherings with Friends and Family: Not on file  . Attends Religious Services: Not on file  . Active Member of Clubs or Organizations: Not on file  . Attends Banker Meetings: Not on file  . Marital Status: Not on file  Intimate Partner Violence:   . Fear of Current or Ex-Partner: Not on file  . Emotionally Abused: Not on file  . Physically Abused: Not on file  . Sexually Abused: Not on file    Family History  Problem Relation Age of Onset  . Prostate cancer Neg Hx   . Bladder Cancer Neg Hx   . Kidney cancer Neg Hx       Review of systems complete and found to be negative unless listed above      PHYSICAL EXAM  General: Well developed, well nourished, in no acute distress HEENT:  Normocephalic and atramatic Neck:  No JVD.  Lungs: Clear bilaterally to auscultation and percussion. Heart: HRRR . Normal S1 and S2 without gallops or murmurs.  Abdomen: Bowel sounds are positive, abdomen soft and non-tender  Msk:  Back normal, normal gait. Normal strength and tone for age. Extremities: No clubbing, cyanosis or edema.   Neuro: Alert and oriented X 3. Psych:  Good affect, responds appropriately  Labs:   Lab Results  Component Value Date   WBC 6.6 10/16/2019   HGB 11.8 (L) 10/16/2019   HCT 36.7 (L) 10/16/2019   MCV 92.0 10/16/2019   PLT 187 10/16/2019    Recent Labs  Lab 10/16/19 1331  NA 139  K 4.4  CL 105  CO2 25  BUN 26*  CREATININE 1.42*  CALCIUM 9.1  GLUCOSE 81   No results found for: CKTOTAL, CKMB, CKMBINDEX, TROPONINI No results found for: CHOL No results found for: HDL No results found for: LDLCALC No results found for: TRIG No results found for:  CHOLHDL No results found for: LDLDIRECT    Radiology: DG Chest 2 View  Result Date: 10/16/2019 CLINICAL DATA:  Chest pain EXAM: CHEST - 2 VIEW COMPARISON:  09/25/2019 FINDINGS: No new consolidation or edema. No pleural effusion or pneumothorax. Stable cardiomediastinal contours with normal heart size. No acute osseous abnormality. IMPRESSION: No acute process in the chest. Electronically Signed   By: Guadlupe Spanish M.D.   On: 10/16/2019 14:09   DG Chest 2 View  Result Date: 09/25/2019 CLINICAL DATA:  Chest pain, shortness of breath EXAM: CHEST - 2 VIEW COMPARISON:  None. FINDINGS: Heart and mediastinal contours are within normal limits. No focal opacities or effusions. No acute bony abnormality. Thoracic spondylosis. IMPRESSION: No active cardiopulmonary disease. Electronically Signed   By: Caryn Bee  Dover M.D.   On: 09/25/2019 11:58    EKG: Normal sinus rhythm nonspecific ST-T wave changes  ASSESSMENT AND PLAN:  1. NSTEMI Coronary artery disease Unstable angina Hypertension Hyperlipidemia Hypothyroidism Abnormal functional study . Plan Agree with admit to telemetry rule out myocardial infarction Follow-up EKGs and troponin Probable non-STEMI could still represent demand ischemia Control with metoprolol Prinivil Imdur Lipid management should be continued with Crestor Echocardiogram preserved left ventricular function previously Recommend cardiac cath in the morning Dr. Darrold Junker to perform  Signed: Alwyn Pea MD 10/16/2019, 4:28 PM

## 2019-10-16 NOTE — ED Provider Notes (Signed)
[facility] Emergency Department Provider Note   ____________________________________________   First MD Initiated Contact with Patient 10/16/19 1338     (approximate)  I have reviewed the triage vital signs and the nursing notes.   HISTORY  Chief Complaint Chest Pain    HPI Sergio Grimes is a 86 y.o. male history of coronary disease hypertension hypercholesterolemia  Patient comes for evaluation from the clinic where he reports that he was seen for chest pain today.  He had a stress test just a couple days ago and was told to come back to the clinic.  They saw him there he continues to have chest pain only with short exertion.  Goes away with resting.  Currently pain and symptom-free but tells me if he gets up and walks even a short distance to start getting pressure in the middle of his chest and pain.  No shortness of breath.  No recent illness no fevers or chills.  Fully vaccinated against Covid  No abdominal pain nausea or vomiting.  Currently being treated with Imdur as well   Past Medical History:  Diagnosis Date  . BPH (benign prostatic hyperplasia)   . Coronary artery disease   . High cholesterol   . Hypertension   . Hypothyroidism     Patient Active Problem List   Diagnosis Date Noted  . NSTEMI (non-ST elevated myocardial infarction) (HCC) 10/16/2019    Past Surgical History:  Procedure Laterality Date  . CATARACT EXTRACTION W/PHACO Left 04/12/2016   Procedure: CATARACT EXTRACTION PHACO AND INTRAOCULAR LENS PLACEMENT (IOC);  Surgeon: Galen Manila, MD;  Location: [facility] ORS;  Service: Ophthalmology;  Laterality: Left;  [place] 02:46 AP% 15.7 CDE 26.07 Fluid pack lot # [id number]  . CATARACT EXTRACTION W/PHACO Right 05/03/2016   Procedure: CATARACT EXTRACTION PHACO AND INTRAOCULAR LENS PLACEMENT (IOC);  Surgeon: Galen Manila, MD;  Location: [facility] ORS;  Service: Ophthalmology;  Laterality: Right;  [place] 02:47 AP% 21.2 CDE 35.56 Fluid Pack  lot # [id number]  . CHOLECYSTECTOMY    . CORONARY ANGIOPLASTY     STENT 2005  . REPOSITION OF LENS Right 06/28/2016   Procedure: REPOSITION OF LENS;  Surgeon: Galen Manila, MD;  Location: [facility] ORS;  Service: Ophthalmology;  Laterality: Right;    Prior to Admission medications   Medication Sig Start Date End Date Taking? Authorizing Provider  amLODipine (NORVASC) 5 MG tablet Take 5 mg by mouth daily. 10/16/19   [provider]  aspirin EC 81 MG tablet Take 81 mg by mouth at bedtime.    [provider]  clopidogrel (PLAVIX) 75 MG tablet Take 75 mg by mouth daily at 12 noon.     [provider]  finasteride (PROSCAR) 5 MG tablet Take 5 mg by mouth every evening.     [provider]  fluoruracil (CARAC) 0.5 % cream Apply 1 application topically daily as needed (rash).    [provider]  isosorbide mononitrate (IMDUR) 60 MG 24 hr tablet Take 60 mg by mouth daily. 10/07/19   [provider]  levothyroxine (SYNTHROID, LEVOTHROID) 88 MCG tablet Take 88 mcg by mouth daily before breakfast.    [provider]  lisinopril (PRINIVIL,ZESTRIL) 5 MG tablet Take 5 mg by mouth daily at 12 noon.     [provider]  metoprolol succinate (TOPROL-XL) 25 MG 24 hr tablet Take 25 mg by mouth daily.    [provider]  Multiple Vitamin (MULTIVITAMIN WITH MINERALS) TABS tablet Take 1 tablet by  mouth daily at 12 noon.    [provider]  rosuvastatin (CRESTOR) 40 MG tablet Take 40 mg by mouth at bedtime.     [provider]  vitamin B-12 (CYANOCOBALAMIN) 500 MCG tablet Take 500 mcg by mouth daily.    [provider]    Allergies Patient has no known allergies.  Family History  Problem Relation Age of Onset  . Prostate cancer Neg Hx   . Bladder Cancer Neg Hx   . Kidney cancer Neg Hx     Social History Social History   Tobacco Use  . Smoking status: Former Games developer  . Smokeless tobacco: Former Ecologist  . Vaping Use: Never used  Substance Use Topics  . Alcohol use: No  . Drug use: No    Review of Systems Constitutional: No fever/chills Eyes: No visual changes. ENT: No sore throat. Cardiovascular: Denies chest pain. Respiratory: Denies shortness of breath. Gastrointestinal: No abdominal pain.   Genitourinary: Negative for dysuria. Musculoskeletal: Negative for back pain. Skin: Negative for rash. Neurological: Negative for headaches, areas of focal weakness or numbness.    ____________________________________________   PHYSICAL EXAM:  VITAL SIGNS: ED Triage Vitals  Enc Vitals Group     BP 10/16/19 1330 (!) 119/50     Pulse Rate 10/16/19 1330 64     Resp 10/16/19 1330 16     Temp 10/16/19 1330 98 F (36.7 C)     Temp Source 10/16/19 1330 Oral     SpO2 10/16/19 1330 99 %     Weight 10/16/19 1329 178 lb (80.7 kg)     Height 10/16/19 1329 5\' 10"  (1.778 m)     Head Circumference --      Peak Flow --      Pain Score 10/16/19 1328 2     Pain Loc --      Pain Edu? --      Excl. in GC? --     Constitutional: Alert and oriented. Well appearing and in no acute distress.  Currently pain-free Eyes: Conjunctivae are normal. Head: Atraumatic. Nose: No congestion/rhinnorhea. Mouth/Throat: Mucous membranes are moist. Neck: No stridor.  Cardiovascular: Normal rate, regular rhythm. Grossly normal heart sounds.  Good peripheral circulation. Respiratory: Normal respiratory effort.  No retractions. Lungs CTAB. Gastrointestinal: Soft and nontender. No distention. Musculoskeletal: No lower extremity tenderness nor edema. Neurologic:  Normal speech and language. No gross focal neurologic deficits are appreciated.  Skin:  Skin is warm, dry and intact. No rash noted. Psychiatric: Mood and affect are normal. Speech and behavior are normal.  ____________________________________________   LABS (all labs ordered are listed, but only abnormal results are displayed)  Labs  Reviewed  BASIC METABOLIC PANEL - Abnormal; Notable for the following components:      Result Value   BUN 26 (*)    Creatinine, Ser 1.42 (*)    GFR, Estimated 43 (*)    All other components within normal limits  CBC - Abnormal; Notable for the following components:   RBC 3.99 (*)    Hemoglobin 11.8 (*)    HCT 36.7 (*)    All other components within normal limits  TROPONIN I (HIGH SENSITIVITY) - Abnormal; Notable for the following components:   Troponin I (High Sensitivity) 479 (*)    All other components within normal limits  RESPIRATORY PANEL BY RT PCR (FLU A&B, COVID)  PROTIME-INR   ____________________________________________  EKG  Reviewed interpreted at 1329 heart rate 60 QRS 90 QTc  400 Normal sinus rhythm, nonspecific T wave abnormality noted in multiple leads.  No STEMI ____________________________________________  RADIOLOGY  DG Chest 2 View  Result Date: 10/16/2019 CLINICAL DATA:  Chest pain EXAM: CHEST - 2 VIEW COMPARISON:  09/25/2019 FINDINGS: No new consolidation or edema. No pleural effusion or pneumothorax. Stable cardiomediastinal contours with normal heart size. No acute osseous abnormality. IMPRESSION: No acute process in the chest. Electronically Signed   By: Guadlupe Spanish M.D.   On: 10/16/2019 14:09    Chest x-ray reviewed by me, no acute. ____________________________________________   PROCEDURES  Procedure(s) performed: None  Procedures  Critical Care performed: Yes, see critical care note(s)  CRITICAL CARE Performed by: Sharyn Creamer   Total critical care time: 30 minutes  Critical care time was exclusive of separately billable procedures and treating other patients.  Critical care was necessary to treat or prevent imminent or life-threatening deterioration.  Critical care was time spent personally by me on the following activities: development of treatment plan with patient and/or surrogate as well as nursing, discussions with consultants,  evaluation of patient's response to treatment, examination of patient, obtaining history from patient or surrogate, ordering and performing treatments and interventions, ordering and review of laboratory studies, ordering and review of radiographic studies, pulse oximetry and re-evaluation of patient's condition.  ____________________________________________   INITIAL IMPRESSION / ASSESSMENT AND PLAN / ED COURSE  Pertinent labs & imaging results that were available during my care of the patient were reviewed by me and considered in my medical decision making (see chart for details).   Differential diagnosis includes, but is not limited to, ACS, aortic dissection, pulmonary embolism, cardiac tamponade, pneumothorax, pneumonia, pericarditis, myocarditis, GI-related causes including esophagitis/gastritis, and musculoskeletal chest wall pain.    Discussed with cardiology, Dr. Juliann Pares, patient will be placed on heparin, admitted to the hospital.  He had a stress test concerning for anterior ischemia.  Discussed with the patient and his family, all in agreement.  Currently pain-free.  Receiving salicylate, placed on heparin infusion for ACS. No STEMI.  Labs reviewed, troponin positive abnormal      ____________________________________________   FINAL CLINICAL IMPRESSION(S) / ED DIAGNOSES  Final diagnoses:  ACS (acute coronary syndrome) (HCC)        Note:  This document was prepared using Dragon voice recognition software and may include unintentional dictation errors       Sharyn Creamer, MD 10/16/19 1428

## 2019-10-16 NOTE — Progress Notes (Signed)
ANTICOAGULATION CONSULT NOTE  Pharmacy Consult for Heparin infusion Indication: chest pain/ACS  No Known Allergies  Patient Measurements: Height: 5\' 10"  (177.8 cm) Weight: 80.7 kg (178 lb) IBW/kg (Calculated) : 73 Heparin Dosing Weight: 80.7 kg  Vital Signs: Temp: 98.1 F (36.7 C) (10/13 1340) Temp Source: Oral (10/13 1340) BP: 144/63 (10/13 1340) Pulse Rate: 57 (10/13 1340)  Labs: Recent Labs    10/16/19 1155 10/16/19 1331  HGB  --  11.8*  HCT  --  36.7*  PLT  --  187  LABPROT  --  12.6  INR  --  1.0  CREATININE  --  1.42*  TROPONINIHS 452* 479*    Estimated Creatinine Clearance: 36.4 mL/min (A) (by C-G formula based on SCr of 1.42 mg/dL (H)).   Medical History: Past Medical History:  Diagnosis Date  . BPH (benign prostatic hyperplasia)   . Coronary artery disease   . High cholesterol   . Hypertension   . Hypothyroidism     Medications:  Per chart review, no anticoagulation prior to admission  Assessment: 86yo male presented to ED c/o chest pain. PMH of CAD s/p RCA stent 2005, HTN, and hypercholesterolemia. Patient gives a 1 month history of chest pain with exertion which has continued to worsen. EKG showed nonspecific ST/T wave abnormalities. Trop 452>479. Pharmacy has been consulted for heparin dosing and monitoring for NSTEMI.   Baseline PT 12.6 and INR 1.0; aPTT pending Hgb 11.8 Plt 187  Goal of Therapy:  Heparin level 0.3-0.7 units/ml Monitor platelets by anticoagulation protocol: Yes   Plan:  Give 4000 units bolus x 1 Start heparin infusion at 950 units/hr Check anti-Xa level in 8 hours and daily while on heparin Continue to monitor H&H and platelets   2006, PharmD Pharmacy Resident  10/16/2019 2:47 PM

## 2019-10-16 NOTE — ED Notes (Signed)
Pt sitting up in bed eating dinner.. Wife remains at bedside

## 2019-10-16 NOTE — ED Notes (Signed)
Pt provided cereal

## 2019-10-17 ENCOUNTER — Encounter
Admission: EM | Disposition: A | Discharge status: Home / Self Care | Payer: Medicare Other | Source: Home / Self Care | Attending: Hospitalist

## 2019-10-17 HISTORY — PX: LEFT HEART CATH AND CORONARY ANGIOGRAPHY: CATH118249

## 2019-10-17 HISTORY — PX: CORONARY STENT INTERVENTION: CATH118234

## 2019-10-17 LAB — LIPID PANEL
Cholesterol: 100 mg/dL (ref 0–200)
HDL: 48 mg/dL
LDL Cholesterol: 44 mg/dL (ref 0–99)
Total CHOL/HDL Ratio: 2.1 ratio
Triglycerides: 42 mg/dL
VLDL: 8 mg/dL (ref 0–40)

## 2019-10-17 LAB — BASIC METABOLIC PANEL WITH GFR
Anion gap: 9 (ref 5–15)
BUN: 24 mg/dL — ABNORMAL HIGH (ref 8–23)
CO2: 23 mmol/L (ref 22–32)
Calcium: 8.6 mg/dL — ABNORMAL LOW (ref 8.9–10.3)
Chloride: 108 mmol/L (ref 98–111)
Creatinine, Ser: 1.29 mg/dL — ABNORMAL HIGH (ref 0.61–1.24)
GFR, Estimated: 49 mL/min — ABNORMAL LOW
Glucose, Bld: 95 mg/dL (ref 70–99)
Potassium: 4 mmol/L (ref 3.5–5.1)
Sodium: 140 mmol/L (ref 135–145)

## 2019-10-17 LAB — CBC
HCT: 32.2 % — ABNORMAL LOW (ref 39.0–52.0)
Hemoglobin: 10.4 g/dL — ABNORMAL LOW (ref 13.0–17.0)
MCH: 29.5 pg (ref 26.0–34.0)
MCHC: 32.3 g/dL (ref 30.0–36.0)
MCV: 91.5 fL (ref 80.0–100.0)
Platelets: 150 10*3/uL (ref 150–400)
RBC: 3.52 MIL/uL — ABNORMAL LOW (ref 4.22–5.81)
RDW: 13.5 % (ref 11.5–15.5)
WBC: 6.5 10*3/uL (ref 4.0–10.5)
nRBC: 0 % (ref 0.0–0.2)

## 2019-10-17 LAB — GLUCOSE, CAPILLARY: Glucose-Capillary: 99 mg/dL (ref 70–99)

## 2019-10-17 LAB — POCT ACTIVATED CLOTTING TIME: Activated Clotting Time: 307 seconds

## 2019-10-17 LAB — HEPARIN LEVEL (UNFRACTIONATED)
Heparin Unfractionated: 0.64 [IU]/mL (ref 0.30–0.70)
Heparin Unfractionated: 0.78 IU/mL — ABNORMAL HIGH (ref 0.30–0.70)

## 2019-10-17 SURGERY — LEFT HEART CATH AND CORONARY ANGIOGRAPHY
Anesthesia: Moderate Sedation

## 2019-10-17 MED ORDER — CLOPIDOGREL BISULFATE 75 MG PO TABS
75.0000 mg | ORAL_TABLET | Freq: Every day | ORAL | Status: DC
  Administered 2019-10-18: 75 mg via ORAL
  Filled 2019-10-17: qty 1

## 2019-10-17 MED ORDER — NITROGLYCERIN 1 MG/10 ML FOR IR/CATH LAB
INTRA_ARTERIAL | Status: DC | PRN
  Administered 2019-10-17 (×2): 200 ug via INTRACORONARY

## 2019-10-17 MED ORDER — SODIUM CHLORIDE 0.9 % IV SOLN: 250.0000 mL | INTRAVENOUS | Status: DC | PRN

## 2019-10-17 MED ORDER — CLOPIDOGREL BISULFATE 75 MG PO TABS
ORAL_TABLET | ORAL | Status: AC
  Filled 2019-10-17: qty 4

## 2019-10-17 MED ORDER — NITROGLYCERIN 1 MG/10 ML FOR IR/CATH LAB
INTRA_ARTERIAL | Status: AC
  Filled 2019-10-17: qty 10

## 2019-10-17 MED ORDER — ONDANSETRON HCL 4 MG/2ML IJ SOLN: 4.0000 mg | Freq: Four times a day (QID) | INTRAMUSCULAR | Status: DC | PRN

## 2019-10-17 MED ORDER — SODIUM CHLORIDE 0.9% FLUSH: 3.0000 mL | INTRAVENOUS | Status: DC | PRN

## 2019-10-17 MED ORDER — ACETAMINOPHEN 325 MG PO TABS: 650.0000 mg | ORAL_TABLET | ORAL | Status: DC | PRN

## 2019-10-17 MED ORDER — SODIUM CHLORIDE 0.9 % IV SOLN: INTRAVENOUS | Status: DC

## 2019-10-17 MED ORDER — MIDAZOLAM HCL 2 MG/2ML IJ SOLN
INTRAMUSCULAR | Status: AC
  Filled 2019-10-17: qty 2

## 2019-10-17 MED ORDER — IOHEXOL 300 MG/ML  SOLN
INTRAMUSCULAR | Status: DC | PRN
  Administered 2019-10-17: 200 mL

## 2019-10-17 MED ORDER — ASPIRIN 81 MG PO CHEW
CHEWABLE_TABLET | ORAL | Status: DC | PRN
  Administered 2019-10-17: 81 mg via ORAL

## 2019-10-17 MED ORDER — VERAPAMIL HCL 2.5 MG/ML IV SOLN
INTRAVENOUS | Status: AC
  Filled 2019-10-17: qty 2

## 2019-10-17 MED ORDER — VERAPAMIL HCL 2.5 MG/ML IV SOLN
INTRAVENOUS | Status: DC | PRN
  Administered 2019-10-17: 2.5 mg via INTRA_ARTERIAL

## 2019-10-17 MED ORDER — BIVALIRUDIN BOLUS VIA INFUSION - CUPID
INTRAVENOUS | Status: DC | PRN
  Administered 2019-10-17: 59.175 mg via INTRAVENOUS

## 2019-10-17 MED ORDER — ASPIRIN 81 MG PO CHEW: 81.0000 mg | CHEWABLE_TABLET | Freq: Every day | ORAL | Status: DC

## 2019-10-17 MED ORDER — FENTANYL CITRATE (PF) 100 MCG/2ML IJ SOLN
INTRAMUSCULAR | Status: AC
  Filled 2019-10-17: qty 2

## 2019-10-17 MED ORDER — ASPIRIN 81 MG PO CHEW
81.0000 mg | CHEWABLE_TABLET | ORAL | Status: AC
  Administered 2019-10-17: 81 mg via ORAL
  Filled 2019-10-17: qty 1

## 2019-10-17 MED ORDER — ENOXAPARIN SODIUM 40 MG/0.4ML ~~LOC~~ SOLN
40.0000 mg | SUBCUTANEOUS | Status: DC
  Administered 2019-10-17: 40 mg via SUBCUTANEOUS

## 2019-10-17 MED ORDER — HEPARIN (PORCINE) IN NACL 1000-0.9 UT/500ML-% IV SOLN
INTRAVENOUS | Status: AC
  Filled 2019-10-17: qty 1000

## 2019-10-17 MED ORDER — HEPARIN (PORCINE) IN NACL 1000-0.9 UT/500ML-% IV SOLN
INTRAVENOUS | Status: DC | PRN
  Administered 2019-10-17: 500 mL

## 2019-10-17 MED ORDER — HEPARIN (PORCINE) IN NACL 2000-0.9 UNIT/L-% IV SOLN
INTRAVENOUS | Status: DC | PRN
  Administered 2019-10-17: 1000 mL

## 2019-10-17 MED ORDER — FENTANYL CITRATE (PF) 100 MCG/2ML IJ SOLN
INTRAMUSCULAR | Status: DC | PRN
  Administered 2019-10-17 (×3): 25 ug via INTRAVENOUS

## 2019-10-17 MED ORDER — SODIUM CHLORIDE 0.9 % WEIGHT BASED INFUSION
1.0000 mL/kg/h | INTRAVENOUS | Status: AC
  Administered 2019-10-17: 1 mL/kg/h via INTRAVENOUS

## 2019-10-17 MED ORDER — HEPARIN SODIUM (PORCINE) 1000 UNIT/ML IJ SOLN
INTRAMUSCULAR | Status: AC
  Filled 2019-10-17: qty 1

## 2019-10-17 MED ORDER — ASPIRIN 81 MG PO CHEW: 81.0000 mg | CHEWABLE_TABLET | ORAL | Status: DC

## 2019-10-17 MED ORDER — LABETALOL HCL 5 MG/ML IV SOLN: 10.0000 mg | INTRAVENOUS | Status: AC | PRN

## 2019-10-17 MED ORDER — BIVALIRUDIN TRIFLUOROACETATE 250 MG IV SOLR
INTRAVENOUS | Status: AC
  Filled 2019-10-17: qty 250

## 2019-10-17 MED ORDER — MIDAZOLAM HCL 2 MG/2ML IJ SOLN
INTRAMUSCULAR | Status: DC | PRN
  Administered 2019-10-17: 0.5 mg via INTRAVENOUS
  Administered 2019-10-17: 1 mg via INTRAVENOUS

## 2019-10-17 MED ORDER — SODIUM CHLORIDE 0.9 % IV SOLN
INTRAVENOUS | Status: AC | PRN
  Administered 2019-10-17: 1.75 mg/kg/h via INTRAVENOUS

## 2019-10-17 MED ORDER — ASPIRIN 81 MG PO CHEW
CHEWABLE_TABLET | ORAL | Status: AC
  Filled 2019-10-17: qty 1

## 2019-10-17 MED ORDER — HYDRALAZINE HCL 20 MG/ML IJ SOLN: 10.0000 mg | INTRAMUSCULAR | Status: AC | PRN

## 2019-10-17 MED ORDER — CLOPIDOGREL BISULFATE 75 MG PO TABS
ORAL_TABLET | ORAL | Status: DC | PRN
  Administered 2019-10-17: 300 mg via ORAL

## 2019-10-17 MED ORDER — SODIUM CHLORIDE 0.9% FLUSH
3.0000 mL | Freq: Two times a day (BID) | INTRAVENOUS | Status: DC
  Administered 2019-10-18: 3 mL via INTRAVENOUS

## 2019-10-17 SURGICAL SUPPLY — 28 items
BALLN MINITREK RX 2.0X12 (BALLOONS) ×3
BALLN ~~LOC~~ TREK RX 2.5X12 (BALLOONS) ×3
BALLOON MINITREK RX 2.0X12 (BALLOONS) ×1 IMPLANT
BALLOON ~~LOC~~ TREK RX 2.5X12 (BALLOONS) ×1 IMPLANT
CATH 5F 110X4 TIG (CATHETERS) ×3 IMPLANT
CATH INFINITI 5FR JL4 (CATHETERS) ×3 IMPLANT
CATH INFINITI JR4 5F (CATHETERS) ×3 IMPLANT
CATH VISTA GUIDE 6FR XB3.5 (CATHETERS) ×3 IMPLANT
DEVICE CLOSURE MYNXGRIP 6/7F (Vascular Products) ×3 IMPLANT
DEVICE RAD TR BAND REGULAR (VASCULAR PRODUCTS) ×3 IMPLANT
DEVICE SAFEGUARD 24CM (GAUZE/BANDAGES/DRESSINGS) ×3 IMPLANT
GLIDESHEATH SLEND SS 6F .021 (SHEATH) ×3 IMPLANT
GUIDEWIRE .025 260CM (WIRE) ×3 IMPLANT
GUIDEWIRE INQWIRE 1.5J.035X260 (WIRE) ×1 IMPLANT
INQWIRE 1.5J .035X260CM (WIRE) ×3
KIT ENCORE 26 ADVANTAGE (KITS) ×3 IMPLANT
KIT HEART LEFT (KITS) ×3 IMPLANT
KIT RIGHT HEART (MISCELLANEOUS) ×3 IMPLANT
NEEDLE PERC 18GX7CM (NEEDLE) ×3 IMPLANT
PACK CARDIAC CATH (CUSTOM PROCEDURE TRAY) ×3 IMPLANT
SHEATH AVANTI 5FR X 11CM (SHEATH) ×3 IMPLANT
SHEATH AVANTI 6FR X 11CM (SHEATH) ×3 IMPLANT
STENT RESOLUTE ONYX 2.25X15 (Permanent Stent) ×3 IMPLANT
TUBING CIL FLEX 10 FLL-RA (TUBING) ×3 IMPLANT
WIRE G HI TQ BMW 190 (WIRE) ×3 IMPLANT
WIRE GUIDERIGHT .035X150 (WIRE) ×3 IMPLANT
WIRE HITORQ VERSACORE ST 145CM (WIRE) ×3 IMPLANT
WIRE ROSEN-J .035X260CM (WIRE) IMPLANT

## 2019-10-17 NOTE — Progress Notes (Signed)
Pt had a complete breakfast and Dr Darrold Junker will proceed with heart Cath

## 2019-10-17 NOTE — Progress Notes (Signed)
ANTICOAGULATION CONSULT NOTE  Pharmacy Consult for Heparin infusion Indication: chest pain/ACS  No Known Allergies  Patient Measurements: Height: 5\' 11"  (180.3 cm) Weight: 78.9 kg (173 lb 14.4 oz) IBW/kg (Calculated) : 75.3 Heparin Dosing Weight: 80.7 kg  Vital Signs: Temp: 98.1 F (36.7 C) (10/13 2233) Temp Source: Oral (10/13 2233) BP: 140/69 (10/13 2253) Pulse Rate: 57 (10/13 2253)  Labs: Recent Labs    10/16/19 1155 10/16/19 1331 10/16/19 1500 10/16/19 2336  HGB  --  11.8*  --   --   HCT  --  36.7*  --   --   PLT  --  187  --   --   APTT  --  29  --   --   LABPROT  --  12.6  --   --   INR  --  1.0  --   --   HEPARINUNFRC  --   --   --  0.64  CREATININE  --  1.42*  --   --   TROPONINIHS 452* 479* 445*  --     Estimated Creatinine Clearance: 37.6 mL/min (A) (by C-G formula based on SCr of 1.42 mg/dL (H)).  Medical History: Past Medical History:  Diagnosis Date  . BPH (benign prostatic hyperplasia)   . Coronary artery disease   . High cholesterol   . Hypertension   . Hypothyroidism     Medications:  Per chart review, no anticoagulation prior to admission  Assessment: 86yo male presented to ED c/o chest pain. PMH of CAD s/p RCA stent 2005, HTN, and hypercholesterolemia. Patient gives a 1 month history of chest pain with exertion which has continued to worsen. EKG showed nonspecific ST/T wave abnormalities. Trop 452>479. Pharmacy has been consulted for heparin dosing and monitoring for NSTEMI.   Baseline PT 12.6 and INR 1.0; aPTT pending Hgb 11.8 Plt 187  Goal of Therapy:  Heparin level 0.3-0.7 units/ml Monitor platelets by anticoagulation protocol: Yes   Plan:  Give 4000 units bolus x 1 Start heparin infusion at 950 units/hr Check anti-Xa level in 8 hours and daily while on heparin Continue to monitor H&H and platelets  1013 2336 HL 0.64, therapeutic x 1.  Will continue Heparin at current rate and recheck HL in 8 hours to confirm.  Check CBC daily  per protocol   2006, PharmD 10/17/2019 12:39 AM

## 2019-10-17 NOTE — Plan of Care (Signed)

## 2019-10-17 NOTE — Progress Notes (Signed)
ANTICOAGULATION CONSULT NOTE  Pharmacy Consult for Heparin infusion Indication: chest pain/ACS  No Known Allergies  Patient Measurements: Height: 5\' 11"  (180.3 cm) Weight: 78.9 kg (173 lb 14.4 oz) IBW/kg (Calculated) : 75.3 Heparin Dosing Weight: 80.7 kg  Vital Signs: Temp: 98.3 F (36.8 C) (10/14 0358) Temp Source: Oral (10/14 0358) BP: 113/56 (10/14 0358) Pulse Rate: 59 (10/14 0358)  Labs: Recent Labs    10/16/19 1155 10/16/19 1331 10/16/19 1500 10/16/19 2336 10/17/19 0436 10/17/19 0728  HGB  --  11.8*  --   --   --  10.4*  HCT  --  36.7*  --   --   --  32.2*  PLT  --  187  --   --   --  150  APTT  --  29  --   --   --   --   LABPROT  --  12.6  --   --   --   --   INR  --  1.0  --   --   --   --   HEPARINUNFRC  --   --   --  0.64  --  0.78*  CREATININE  --  1.42*  --   --  1.29*  --   TROPONINIHS 452* 479* 445*  --   --   --     Estimated Creatinine Clearance: 41.3 mL/min (A) (by C-G formula based on SCr of 1.29 mg/dL (H)).  Medical History: Past Medical History:  Diagnosis Date  . BPH (benign prostatic hyperplasia)   . Coronary artery disease   . High cholesterol   . Hypertension   . Hypothyroidism     Medications:  Per chart review, no anticoagulation prior to admission  Assessment: 86yo male presented to ED c/o chest pain. PMH of CAD s/p RCA stent 2005, HTN, and hypercholesterolemia. Patient gives a 1 month history of chest pain with exertion which has continued to worsen. EKG showed nonspecific ST/T wave abnormalities. Trop 452>479. Pharmacy has been consulted for heparin dosing and monitoring for NSTEMI. Plan for cath 10/14.   10/13 1331 HL 0.64 10/14 0728 HL 0.78   Goal of Therapy:  Heparin level 0.3-0.7 units/ml Monitor platelets by anticoagulation protocol: Yes   Plan:  Heparin is slightly supratherapeutic.  Will decrease Heparin infusion to 850 units/hr. Recheck HL in 8 hours. CBC daily while on heparin .    11/14, PharmD,  BCPS 10/17/2019 8:10 AM

## 2019-10-17 NOTE — Progress Notes (Signed)
PROGRESS NOTE    Sergio Grimes  BOF:[id number] DOB: March 01, 1928 DOA: 10/16/2019 PCP: Lynnea Ferrier, MD    Assessment & Plan:   Principal Problem:   NSTEMI (non-ST elevated myocardial infarction) [facility]) Active Problems:   Coronary artery disease   Hypertension   Hypothyroidism    Sergio Grimes is a 86 y.o. male with medical history significant for coronary artery disease status post RCA stent in 2005, hypertension, dyslipidemia who was sent to the emergency room from his cardiologist office where he went for evaluation of chest pain.    Non-ST elevation MI Hx of coronary artery disease status post RCA stent Patient with complaints of exertional chest pain with trop in 400's.  Started on heparin gtt. PLAN: --left heart cath today, with stent to 99% mid LAD --ASA and plavix for 1 year --Continue nitrates, statins, low-dose beta-blocker, Imdur  Right groin hematoma --monitor  Hypothyroidism --continue Synthroid  BPH --continue finasteride  HLD --continue statin   DVT prophylaxis: Lovenox SQ Code Status: Full code  Family Communication: wife and daughter updated at the bedside today Status is: inpatient Dispo:   The patient is from: home Anticipated d/c is to: home Anticipated d/c date is: tomorrow Patient currently is not medically stable to d/c due to: elderly pt just received a stent, had right groin hematoma, need overnight monitoring.   Subjective and Interval History:  Pt went for heart cath today and received a stent.  Post-cath, pt reported resolution of chest pain.   Objective: Vitals:   10/17/19 1400 10/17/19 1518 10/17/19 1737 10/17/19 1918  BP: (!) 124/57 (!) 147/73 137/74 125/71  Pulse: 61 65 64 68  Resp: 16 18  20   Temp:  98.1 F (36.7 C) (!) 97.5 F (36.4 C) 97.9 F (36.6 C)  TempSrc:  Oral Oral Oral  SpO2: 97% 100% 99% 97%  Weight:      Height:        Intake/Output Summary (Last 24 hours) at 10/17/2019 2046 Last data filed at  10/17/2019 1845 Gross per 24 hour  Intake 480.74 ml  Output 450 ml  Net 30.74 ml   Filed Weights   10/16/19 2233 10/17/19 0358 10/17/19 0930  Weight: 78.9 kg 78.9 kg 78.9 kg    Examination:   Constitutional: NAD, AAOx3 HEENT: conjunctivae and lids normal, EOMI CV: No cyanosis.   RESP: normal respiratory effort, on RA Extremities: No effusions, edema in BLE SKIN: warm, dry and intact Neuro: II - XII grossly intact.   Psych: Normal mood and affect.  Appropriate judgement and reason   Data Reviewed: I have personally reviewed following labs and imaging studies  CBC: Recent Labs  Lab 10/16/19 1331 10/17/19 0728  WBC 6.6 6.5  HGB 11.8* 10.4*  HCT 36.7* 32.2*  MCV 92.0 91.5  PLT 187 150   Basic Metabolic Panel: Recent Labs  Lab 10/16/19 1331 10/17/19 0436  NA 139 140  K 4.4 4.0  CL 105 108  CO2 25 23  GLUCOSE 81 95  BUN 26* 24*  CREATININE 1.42* 1.29*  CALCIUM 9.1 8.6*   GFR: Estimated Creatinine Clearance: 41.3 mL/min (A) (by C-G formula based on SCr of 1.29 mg/dL (H)). Liver Function Tests: No results for input(s): AST, ALT, ALKPHOS, BILITOT, PROT, ALBUMIN in the last 168 hours. No results for input(s): LIPASE, AMYLASE in the last 168 hours. No results for input(s): AMMONIA in the last 168 hours. Coagulation Profile: Recent Labs  Lab 10/16/19 1331  INR 1.0  Cardiac Enzymes: No results for input(s): CKTOTAL, CKMB, CKMBINDEX, TROPONINI in the last 168 hours. BNP (last 3 results) No results for input(s): PROBNP in the last 8760 hours. HbA1C: No results for input(s): HGBA1C in the last 72 hours. CBG: Recent Labs  Lab 10/17/19 1216  GLUCAP 99   Lipid Profile: Recent Labs    10/17/19 0436  CHOL 100  HDL 48  LDLCALC 44  TRIG 42  CHOLHDL 2.1   Thyroid Function Tests: Recent Labs    10/16/19 1155  TSH 5.152*   Anemia Panel: No results for input(s): VITAMINB12, FOLATE, FERRITIN, TIBC, IRON, RETICCTPCT in the last 72 hours. Sepsis  Labs: No results for input(s): PROCALCITON, LATICACIDVEN in the last 168 hours.  Recent Results (from the past 240 hour(s))  Respiratory Panel by RT PCR (Flu A&B, Covid) - Nasopharyngeal Swab     Status: None   Collection Time: 10/16/19  2:08 PM   Specimen: Nasopharyngeal Swab  Result Value Ref Range Status   SARS Coronavirus 2 by RT PCR NEGATIVE NEGATIVE Final    Comment: (NOTE) SARS-CoV-2 target nucleic acids are NOT DETECTED.  The SARS-CoV-2 RNA is generally detectable in upper respiratoy specimens during the acute phase of infection. The lowest concentration of SARS-CoV-2 viral copies this assay can detect is 131 copies/mL. A negative result does not preclude SARS-Cov-2 infection and should not be used as the sole basis for treatment or other patient management decisions. A negative result may occur with  improper specimen collection/handling, submission of specimen other than nasopharyngeal swab, presence of viral mutation(s) within the areas targeted by this assay, and inadequate number of viral copies (<131 copies/mL). A negative result must be combined with clinical observations, patient history, and epidemiological information. The expected result is Negative.  Fact Sheet for Patients:  https://www.moore.com/  Fact Sheet for Healthcare Providers:  https://www.young.biz/  This test is no t yet approved or cleared by the [place] FDA and  has been authorized for detection and/or diagnosis of SARS-CoV-2 by FDA under an Emergency Use Authorization (EUA). This EUA will remain  in effect (meaning this test can be used) for the duration of the COVID-19 declaration under Section 564(b)(1) of the Act, 21 U.S.C. section 360bbb-3(b)(1), unless the authorization is terminated or revoked sooner.     Influenza A by PCR NEGATIVE NEGATIVE Final   Influenza B by PCR NEGATIVE NEGATIVE Final    Comment: (NOTE) The Xpert Xpress  SARS-CoV-2/FLU/RSV assay is intended as an aid in  the diagnosis of influenza from Nasopharyngeal swab specimens and  should not be used as a sole basis for treatment. Nasal washings and  aspirates are unacceptable for Xpert Xpress SARS-CoV-2/FLU/RSV  testing.  Fact Sheet for Patients: https://www.moore.com/  Fact Sheet for Healthcare Providers: https://www.young.biz/  This test is not yet approved or cleared by the [place] FDA and  has been authorized for detection and/or diagnosis of SARS-CoV-2 by  FDA under an Emergency Use Authorization (EUA). This EUA will remain  in effect (meaning this test can be used) for the duration of the  Covid-19 declaration under Section 564(b)(1) of the Act, 21  U.S.C. section 360bbb-3(b)(1), unless the authorization is  terminated or revoked. Performed at [facility], [address], [place], [place] [address]       Radiology Studies: DG Chest 2 View  Result Date: 10/16/2019 CLINICAL DATA:  Chest pain EXAM: CHEST - 2 VIEW COMPARISON:  09/25/2019 FINDINGS: No new consolidation or edema. No pleural effusion or pneumothorax. Stable  cardiomediastinal contours with normal heart size. No acute osseous abnormality. IMPRESSION: No acute process in the chest. Electronically Signed   By: Guadlupe Spanish M.D.   On: 10/16/2019 14:09   CARDIAC CATHETERIZATION  Result Date: 10/17/2019  Mid LAD-1 lesion is 99% stenosed.  Mid LAD-2 lesion is 50% stenosed.  Previously placed Ost RCA to Prox RCA stent (unknown type) is widely patent.  Prox RCA lesion is 20% stenosed.  Dist RCA lesion is 30% stenosed.  RPDA lesion is 40% stenosed.  A drug-eluting stent was successfully placed using a STENT RESOLUTE ONYX 2.25X15.  Post intervention, there is a 0% residual stenosis.  1.  One-vessel coronary artery disease with 99% stenosis mid LAD 2.  Mildly reduced left ventricular function with LVEF of 45% 3.  Successful  PCI with DES mid LAD 4.  Right groin hematoma Recommendations 1.  Dual antiplatelet therapy uninterrupted for 1 year 2.  Femostop 3.  Probable discharge in a.m.     Scheduled Meds: . aspirin EC  81 mg Oral QHS  . clopidogrel  75 mg Oral Q1200  . [START ON 10/18/2019] clopidogrel  75 mg Oral Q breakfast  . finasteride  5 mg Oral QPM  . isosorbide mononitrate  30 mg Oral Daily  . levothyroxine  88 mcg Oral QAC breakfast  . metoprolol succinate  25 mg Oral Daily  . multivitamin with minerals  1 tablet Oral Q1200  . rosuvastatin  40 mg Oral QHS  . sodium chloride flush  3 mL Intravenous Q12H  . sodium chloride flush  3 mL Intravenous Q12H  . vitamin B-12  500 mcg Oral Daily   Continuous Infusions: . sodium chloride    . sodium chloride 1 mL/kg/hr (10/17/19 1536)     LOS: 1 day     Darlin Priestly, MD [facility] If 7PM-7AM, please contact night-coverage 10/17/2019, 8:46 PM

## 2019-10-18 ENCOUNTER — Encounter: Payer: Self-pay | Admitting: Cardiology

## 2019-10-18 LAB — CBC
HCT: 32.4 % — ABNORMAL LOW (ref 39.0–52.0)
Hemoglobin: 10.6 g/dL — ABNORMAL LOW (ref 13.0–17.0)
MCH: 29.7 pg (ref 26.0–34.0)
MCHC: 32.7 g/dL (ref 30.0–36.0)
MCV: 90.8 fL (ref 80.0–100.0)
Platelets: 174 10*3/uL (ref 150–400)
RBC: 3.57 MIL/uL — ABNORMAL LOW (ref 4.22–5.81)
RDW: 13.4 % (ref 11.5–15.5)
WBC: 8.1 10*3/uL (ref 4.0–10.5)
nRBC: 0 % (ref 0.0–0.2)

## 2019-10-18 LAB — BASIC METABOLIC PANEL
Anion gap: 7 (ref 5–15)
BUN: 24 mg/dL — ABNORMAL HIGH (ref 8–23)
CO2: 22 mmol/L (ref 22–32)
Calcium: 8.7 mg/dL — ABNORMAL LOW (ref 8.9–10.3)
Chloride: 107 mmol/L (ref 98–111)
Creatinine, Ser: 1.42 mg/dL — ABNORMAL HIGH (ref 0.61–1.24)
GFR, Estimated: 43 mL/min — ABNORMAL LOW (ref >60)
Glucose, Bld: 99 mg/dL (ref 70–99)
Potassium: 4.2 mmol/L (ref 3.5–5.1)
Sodium: 136 mmol/L (ref 135–145)

## 2019-10-18 LAB — MAGNESIUM: Magnesium: 2.1 mg/dL (ref 1.7–2.4)

## 2019-10-18 NOTE — Plan of Care (Signed)

## 2019-10-18 NOTE — Discharge Summary (Signed)
Physician Discharge Summary      Patient ID: Sergio Grimes MRN: [id number] DOB/AGE: 1928/12/05 86 y.o.  Admit date: 10/16/2019 Discharge date: 10/18/2019  Primary Discharge Diagnosis non-STEMI unstable angina Secondary Discharge Diagnosis coronary disease status post PCI and stent  Significant Diagnostic Studies: Cardiac cath with significant disease in LAD status post PCI and stent with DES to mid LAD  Consults: cardiology  Hospital Course: Patient was admitted to the emergency room with unstable anginal type symptoms he been followed in the office has been treated with Imdur but symptoms accelerated she came to the emergency room unfortunately had positive troponins suggestive of a non-STEMI EKG was nondiagnostic patient was then taken to the Cath Lab by Dr. Pressures and found to have a high-grade 99% mid LAD lesion which was stented with a DES patient was to be maintained on Plavix and aspirin statin metoprolol and follow-up with cardiology 1 to 2 weeks.  Patient is also be referred to cardiac rehab   Discharge Exam: Blood pressure (!) 105/52, pulse 62, temperature 99.1 F (37.3 C), temperature source Oral, resp. rate 16, height 5\' 11"  (1.803 m), weight 81 kg, SpO2 97 %.   General appearance: appears stated age Resp: clear to auscultation bilaterally Cardio: regular rate and rhythm, S1, S2 normal, no murmur, click, rub or gallop Extremities: extremities normal, atraumatic, no cyanosis or edema Pulses: 2+ and symmetric Skin: Skin color, texture, turgor normal. No rashes or lesions Neurologic: Alert and oriented X 3, normal strength and tone. Normal symmetric reflexes. Normal coordination and gait Labs:   Lab Results  Component Value Date   WBC 8.1 10/18/2019   HGB 10.6 (L) 10/18/2019   HCT 32.4 (L) 10/18/2019   MCV 90.8 10/18/2019   PLT 174 10/18/2019    Recent Labs  Lab 10/18/19 0600  NA 136  K 4.2  CL 107  CO2 22  BUN 24*  CREATININE 1.42*  CALCIUM 8.7*    GLUCOSE 99      Radiology: Chest x-ray unremarkable EKG: Normal sinus rhythm nonspecific ST-T wave changes  FOLLOW UP PLANS AND APPOINTMENTS Discharge Instructions    AMB Referral to Cardiac Rehabilitation - Phase II   Complete by: As directed    Diagnosis:  NSTEMI Coronary Stents     After initial evaluation and assessments completed: Virtual Based Care may be provided alone or in conjunction with Phase 2 Cardiac Rehab based on patient barriers.: Yes   Discharge instructions   Complete by: As directed    You have received a stent to a 99% blocked heart artery.  Be sure to take aspirin 81 and plavix 75 mg daily together for a year, last day 10/17/2020.   Dr. 10/19/2020 - -     Allergies as of 10/18/2019   No Known Allergies     Medication List    STOP taking these medications   amLODipine 5 MG tablet Commonly known as: NORVASC   isosorbide mononitrate 60 MG 24 hr tablet Commonly known as: IMDUR     TAKE these medications   aspirin EC 81 MG tablet Take 81 mg by mouth at bedtime.   clopidogrel 75 MG tablet Commonly known as: PLAVIX Take 75 mg by mouth daily at 12 noon.   finasteride 5 MG tablet Commonly known as: PROSCAR Take 5 mg by mouth every evening.   fluoruracil 0.5 % cream Commonly known as: CARAC Apply 1 application topically daily as needed (rash).   levothyroxine 88 MCG tablet Commonly known as:  SYNTHROID Take 88 mcg by mouth daily before breakfast.   lisinopril 5 MG tablet Commonly known as: ZESTRIL Take 5 mg by mouth daily at 12 noon.   metoprolol succinate 25 MG 24 hr tablet Commonly known as: TOPROL-XL Take 25 mg by mouth daily.   multivitamin with minerals Tabs tablet Take 1 tablet by mouth daily at 12 noon.   rosuvastatin 40 MG tablet Commonly known as: CRESTOR Take 40 mg by mouth at bedtime.   vitamin B-12 500 MCG tablet Commonly known as: CYANOCOBALAMIN Take 500 mcg by mouth daily.       Follow-up Information     Paraschos, Alexander, MD Follow up in 2 week(s).   Specialty: Cardiology Contact information: [address] [facility] [place] [address] [phone]               BRING ALL MEDICATIONS WITH YOU TO FOLLOW UP APPOINTMENTS  Time spent with patient to include physician time: 30 minutes Signed:  Alwyn Pea MD 10/18/2019, 9:04 AM

## 2019-10-18 NOTE — Discharge Instructions (Signed)
Continue usual medications Plavix and aspirin for at least 1 year Minimal use of right wrist for 1 to 2 weeks Follow-up with Dr. Darrold Junker 1 to 2 weeks Call if any problems or concerns

## 2019-10-18 NOTE — Care Management Important Message (Signed)
Important Message  Patient Details  Name: Sergio Grimes MRN: [id number] Date of Birth: 03/12/1928   Medicare Important Message Given:  No  Patient discharged prior to arrival to unit to deliver concurrent Medicare IM.   Johnell Comings 10/18/2019, 2:15 PM

## 2019-10-24 ENCOUNTER — Other Ambulatory Visit: Payer: Self-pay

## 2019-10-24 ENCOUNTER — Encounter: Payer: Medicare Other | Attending: Cardiology

## 2019-10-24 DIAGNOSIS — I214 Non-ST elevation (NSTEMI) myocardial infarction: Secondary | ICD-10-CM | POA: Insufficient documentation

## 2019-10-24 DIAGNOSIS — Z955 Presence of coronary angioplasty implant and graft: Secondary | ICD-10-CM | POA: Insufficient documentation

## 2019-10-24 NOTE — Progress Notes (Signed)
Virtual Visit completed. Patient informed on EP and RD appointment and 6 Minute walk test. Patient also informed of patient health questionnaires on My Chart. Patient Verbalizes understanding. Visit diagnosis can be found in [facility] 10/16/2019. 

## 2019-10-28 ENCOUNTER — Encounter: Payer: Medicare Other | Admitting: *Deleted

## 2019-10-28 ENCOUNTER — Other Ambulatory Visit: Payer: Self-pay

## 2019-10-28 VITALS — Ht 69.6 in | Wt 177.5 lb

## 2019-10-28 DIAGNOSIS — I214 Non-ST elevation (NSTEMI) myocardial infarction: Secondary | ICD-10-CM

## 2019-10-28 DIAGNOSIS — Z955 Presence of coronary angioplasty implant and graft: Secondary | ICD-10-CM

## 2019-10-28 NOTE — Patient Instructions (Signed)
Patient Instructions  Patient Details  Name: Sergio Grimes MRN: [id number] Date of Birth: 11-Feb-1928 Referring Provider:  Marcina Millard, MD  Below are your personal goals for exercise, nutrition, and risk factors. Our goal is to help you stay on track towards obtaining and maintaining these goals. We will be discussing your progress on these goals with you throughout the program.  Initial Exercise Prescription:  Initial Exercise Prescription - 10/28/19 1300      Date of Initial Exercise RX and Referring Provider   Date 10/28/19    Referring Provider Paraschos, Alexander MD      Treadmill   MPH 1.5    Grade 0.5    Minutes 15    METs 2.15      NuStep   Level 1    SPM 80    Minutes 15    METs 2      T5 Nustep   Level 1    SPM 80    Minutes 15    METs 2      Biostep-RELP   Level 1    SPM 50    Minutes 15    METs 2      Prescription Details   Frequency (times per week) 2    Duration Progress to 30 minutes of continuous aerobic without signs/symptoms of physical distress      Intensity   THRR 40-80% of Max Heartrate 88-117    Ratings of Perceived Exertion 11-13    Perceived Dyspnea 0-4      Progression   Progression Continue to progress workloads to maintain intensity without signs/symptoms of physical distress.      Resistance Training   Training Prescription Yes    Weight 3 lb    Reps 10-15           Exercise Goals: Frequency: Be able to perform aerobic exercise two to three times per week in program working toward 2-5 days per week of home exercise.  Intensity: Work with a perceived exertion of 11 (fairly light) - 15 (hard) while following your exercise prescription.  We will make changes to your prescription with you as you progress through the program.   Duration: Be able to do 30 to 45 minutes of continuous aerobic exercise in addition to a 5 minute warm-up and a 5 minute cool-down routine.   Nutrition Goals: Your personal nutrition goals  will be established when you do your nutrition analysis with the dietician.  The following are general nutrition guidelines to follow: Cholesterol < 200mg /day Sodium < 1500mg /day Fiber: Men over 50 yrs - 30 grams per day  Personal Goals:  Personal Goals and Risk Factors at Admission - 10/28/19 1352      Core Components/Risk Factors/Patient Goals on Admission    Weight Management Yes;Weight Loss    Intervention Weight Management: Develop a combined nutrition and exercise program designed to reach desired caloric intake, while maintaining appropriate intake of nutrient and fiber, sodium and fats, and appropriate energy expenditure required for the weight goal.;Weight Management: Provide education and appropriate resources to help participant work on and attain dietary goals.;Weight Management/Obesity: Establish reasonable short term and long term weight goals.    Admit Weight 177 lb 8 oz (80.5 kg)    Goal Weight: Short Term 172 lb (78 kg)    Goal Weight: Long Term 170 lb (77.1 kg)    Expected Outcomes Short Term: Continue to assess and modify interventions until short term weight is achieved;Long Term: Adherence to nutrition and  physical activity/exercise program aimed toward attainment of established weight goal;Understanding recommendations for meals to include 15-35% energy as protein, 25-35% energy from fat, 35-60% energy from carbohydrates, less than 200mg  of dietary cholesterol, 20-35 gm of total fiber daily;Understanding of distribution of calorie intake throughout the day with the consumption of 4-5 meals/snacks;Weight Loss: Understanding of general recommendations for a balanced deficit meal plan, which promotes 1-2 lb weight loss per week and includes a negative energy balance of [phone] kcal/d    Hypertension Yes    Intervention Provide education on lifestyle modifcations including regular physical activity/exercise, weight management, moderate sodium restriction and increased consumption  of fresh fruit, vegetables, and low fat dairy, alcohol moderation, and smoking cessation.;Monitor prescription use compliance.    Expected Outcomes Short Term: Continued assessment and intervention until BP is < 140/22mm HG in hypertensive participants. < 130/37mm HG in hypertensive participants with diabetes, heart failure or chronic kidney disease.;Long Term: Maintenance of blood pressure at goal levels.    Lipids Yes    Intervention Provide education and support for participant on nutrition & aerobic/resistive exercise along with prescribed medications to achieve LDL 70mg , HDL >40mg .    Expected Outcomes Short Term: Participant states understanding of desired cholesterol values and is compliant with medications prescribed. Participant is following exercise prescription and nutrition guidelines.;Long Term: Cholesterol controlled with medications as prescribed, with individualized exercise RX and with personalized nutrition plan. Value goals: LDL < 70mg , HDL > 40 mg.           Tobacco Use Initial Evaluation: Social History   Tobacco Use  Smoking Status Former Smoker  . Packs/day: 1.00  . Years: 20.00  . Pack years: 20.00  . Types: Cigarettes  . Quit date: 01/24/1976  . Years since quitting: 43.7  Smokeless Tobacco Former    Exercise Goals and Review:  Exercise Goals    Row Name 10/28/19 1348             Exercise Goals   Increase Physical Activity Yes       Intervention Provide advice, education, support and counseling about physical activity/exercise needs.;Develop an individualized exercise prescription for aerobic and resistive training based on initial evaluation findings, risk stratification, comorbidities and participant's personal goals.       Expected Outcomes Short Term: Attend rehab on a regular basis to increase amount of physical activity.;Long Term: Add in home exercise to make exercise part of routine and to increase amount of physical activity.;Long Term:  Exercising regularly at least 3-5 days a week.       Increase Strength and Stamina Yes       Intervention Provide advice, education, support and counseling about physical activity/exercise needs.;Develop an individualized exercise prescription for aerobic and resistive training based on initial evaluation findings, risk stratification, comorbidities and participant's personal goals.       Expected Outcomes Short Term: Increase workloads from initial exercise prescription for resistance, speed, and METs.;Short Term: Perform resistance training exercises routinely during rehab and add in resistance training at home;Long Term: Improve cardiorespiratory fitness, muscular endurance and strength as measured by increased METs and functional capacity (Neurosurgeon)       Able to understand and use rate of perceived exertion (RPE) scale Yes       Intervention Provide education and explanation on how to use RPE scale       Expected Outcomes Long Term:  Able to use RPE to guide intensity level when exercising independently;Short Term: Able to use RPE daily in rehab to  express subjective intensity level       Able to understand and use Dyspnea scale Yes       Intervention Provide education and explanation on how to use Dyspnea scale       Expected Outcomes Short Term: Able to use Dyspnea scale daily in rehab to express subjective sense of shortness of breath during exertion;Long Term: Able to use Dyspnea scale to guide intensity level when exercising independently       Knowledge and understanding of Target Heart Rate Range (THRR) Yes       Intervention Provide education and explanation of THRR including how the numbers were predicted and where they are located for reference       Expected Outcomes Short Term: Able to state/look up THRR;Short Term: Able to use daily as guideline for intensity in rehab;Long Term: Able to use THRR to govern intensity when exercising independently       Able to check pulse independently Yes        Intervention Provide education and demonstration on how to check pulse in carotid and radial arteries.;Review the importance of being able to check your own pulse for safety during independent exercise       Expected Outcomes Short Term: Able to explain why pulse checking is important during independent exercise;Long Term: Able to check pulse independently and accurately       Understanding of Exercise Prescription Yes       Intervention Provide education, explanation, and written materials on patient's individual exercise prescription       Expected Outcomes Short Term: Able to explain program exercise prescription;Long Term: Able to explain home exercise prescription to exercise independently              Copy of goals given to participant.

## 2019-10-28 NOTE — Progress Notes (Signed)
Cardiac Individual Treatment Plan  Patient Details  Name: Sergio Grimes MRN: [id number] Date of Birth: 1928/11/26 Referring Provider:     Cardiac Rehab from 10/28/2019 in [facility] Cardiac and Pulmonary Rehab  Referring Provider Isaias Cowman MD      Initial Encounter Date:    Cardiac Rehab from 10/28/2019 in [facility] Cardiac and Pulmonary Rehab  Date 10/28/19      Visit Diagnosis: NSTEMI (non-ST elevated myocardial infarction) [facility])  Status post coronary artery stent placement  Patient's Home Medications on Admission:  Current Outpatient Medications:  .  aspirin EC 81 MG tablet, Take 81 mg by mouth at bedtime., Disp: , Rfl:  .  clopidogrel (PLAVIX) 75 MG tablet, Take 75 mg by mouth daily at 12 noon. , Disp: , Rfl:  .  finasteride (PROSCAR) 5 MG tablet, Take 5 mg by mouth every evening. , Disp: , Rfl:  .  fluoruracil (CARAC) 0.5 % cream, Apply 1 application topically daily as needed (rash)., Disp: , Rfl:  .  levothyroxine (SYNTHROID, LEVOTHROID) 88 MCG tablet, Take 88 mcg by mouth daily before breakfast., Disp: , Rfl:  .  lisinopril (PRINIVIL,ZESTRIL) 5 MG tablet, Take 5 mg by mouth daily at 12 noon. , Disp: , Rfl:  .  metoprolol succinate (TOPROL-XL) 25 MG 24 hr tablet, Take 25 mg by mouth daily., Disp: , Rfl:  .  Multiple Vitamin (MULTIVITAMIN WITH MINERALS) TABS tablet, Take 1 tablet by mouth daily at 12 noon., Disp: , Rfl:  .  rosuvastatin (CRESTOR) 40 MG tablet, Take 40 mg by mouth at bedtime. , Disp: , Rfl:  .  vitamin B-12 (CYANOCOBALAMIN) 500 MCG tablet, Take 500 mcg by mouth daily. , Disp: , Rfl:   Past Medical History: Past Medical History:  Diagnosis Date  . BPH (benign prostatic hyperplasia)   . Coronary artery disease   . High cholesterol   . Hypertension   . Hypothyroidism     Tobacco Use: Social History   Tobacco Use  Smoking Status Former Smoker  . Packs/day: 1.00  . Years: 20.00  . Pack years: 20.00  . Types: Cigarettes  . Quit date: 01/24/1976  .  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   Exercise Prescription Goal: Initial exercise prescription builds to 30-45 minutes a day of aerobic activity, 2-3 days per week.  Home exercise guidelines will be given to patient during program as part of exercise prescription that the participant will acknowledge.   Education: Aerobic Exercise & Resistance Training: - Gives group verbal and written instruction on the various components of exercise. Focuses on aerobic and resistive training programs and the benefits of this training and how to safely progress through these programs..   Education: Exercise & Equipment Safety: - Individual verbal instruction and demonstration of equipment use and safety with use of the equipment.   Cardiac Rehab from 10/28/2019 in [facility] Cardiac and Pulmonary Rehab  Date 10/24/19  Educator [facility]  Instruction Review Code 1- Verbalizes Understanding  Education: Exercise Physiology & General Exercise Guidelines: - Group verbal and written instruction with models to review the exercise physiology of the cardiovascular system and associated critical values. Provides general exercise guidelines with specific guidelines to those with heart or lung disease.    Cardiac Rehab from 10/28/2019 in [facility] Cardiac and Pulmonary Rehab  Education need identified 10/28/19      Education: Flexibility, Balance, Mind/Body Relaxation: Provides group verbal/written instruction on the benefits of flexibility and balance training, including  mind/body exercise modes such as yoga, pilates and tai chi.  Demonstration and skill practice provided.   Activity Barriers & Risk Stratification:  Activity Barriers & Cardiac Risk Stratification - 10/28/19 1326      Activity Barriers & Cardiac Risk Stratification   Activity Barriers Muscular Weakness;Deconditioning;Balance Concerns;Shortness of Breath;Other (comment)    Comments walks with limp on R side.  Drags right foot some    Cardiac Risk Stratification Moderate           6 Minute Walk:  6 Minute Walk    Row Name 10/28/19 1323         6 Minute Walk   Phase Initial     Distance 880 feet     Walk Time 6 minutes     # of Rest Breaks 0     MPH 1.67     METS 1.12     RPE 12     Perceived Dyspnea  3     VO2 Peak 3.91     Symptoms Yes (comment)     Comments SOB     Resting HR 59 bpm     Resting BP 106/54     Resting Oxygen Saturation  100 %     Exercise Oxygen Saturation  during 6 min walk 100 %     Max Ex. HR 85 bpm     Max Ex. BP 128/64     2 Minute Post BP 112/64            Oxygen Initial Assessment:   Oxygen Re-Evaluation:   Oxygen Discharge (Final Oxygen Re-Evaluation):   Initial Exercise Prescription:  Initial Exercise Prescription - 10/28/19 1300      Date of Initial Exercise RX and Referring Provider   Date 10/28/19    Referring Provider Paraschos, Alexander MD      Treadmill   MPH 1.5    Grade 0.5    Minutes 15    METs 2.15      NuStep   Level 1    SPM 80    Minutes 15    METs 2      T5 Nustep   Level 1    SPM 80    Minutes 15    METs 2      Biostep-RELP   Level 1    SPM 50    Minutes 15    METs 2      Prescription Details   Frequency (times per week) 2    Duration Progress to 30 minutes of continuous aerobic without signs/symptoms of physical distress      Intensity   THRR 40-80% of Max Heartrate 88-117    Ratings of Perceived Exertion 11-13    Perceived Dyspnea 0-4      Progression   Progression Continue to  progress workloads to maintain intensity without signs/symptoms of physical distress.      Resistance Training   Training Prescription Yes    Weight 3 lb    Reps 10-15  Perform Capillary Blood Glucose checks as needed.  Exercise Prescription Changes:  Exercise Prescription Changes    Row Name 10/28/19 1300             Response to Exercise   Blood Pressure (Admit) 106/54       Blood Pressure (Exercise) 128/64       Blood Pressure (Exit) 112/64       Heart Rate (Admit) 59 bpm       Heart Rate (Exercise) 85 bpm       Heart Rate (Exit) 63 bpm       Oxygen Saturation (Admit) 100 %       Oxygen Saturation (Exercise) 100 %       Rating of Perceived Exertion (Exercise) 12       Perceived Dyspnea (Exercise) 3       Symptoms none       Comments walk test results              Exercise Comments:   Exercise Goals and Review:  Exercise Goals    Row Name 10/28/19 1348             Exercise Goals   Increase Physical Activity Yes       Intervention Provide advice, education, support and counseling about physical activity/exercise needs.;Develop an individualized exercise prescription for aerobic and resistive training based on initial evaluation findings, risk stratification, comorbidities and participant's personal goals.       Expected Outcomes Short Term: Attend rehab on a regular basis to increase amount of physical activity.;Long Term: Add in home exercise to make exercise part of routine and to increase amount of physical activity.;Long Term: Exercising regularly at least 3-5 days a week.       Increase Strength and Stamina Yes       Intervention Provide advice, education, support and counseling about physical activity/exercise needs.;Develop an individualized exercise prescription for aerobic and resistive training based on initial evaluation findings, risk stratification, comorbidities and participant's personal goals.       Expected Outcomes Short Term:  Increase workloads from initial exercise prescription for resistance, speed, and METs.;Short Term: Perform resistance training exercises routinely during rehab and add in resistance training at home;Long Term: Improve cardiorespiratory fitness, muscular endurance and strength as measured by increased METs and functional capacity (6MWT)       Able to understand and use rate of perceived exertion (RPE) scale Yes       Intervention Provide education and explanation on how to use RPE scale       Expected Outcomes Long Term:  Able to use RPE to guide intensity level when exercising independently;Short Term: Able to use RPE daily in rehab to express subjective intensity level       Able to understand and use Dyspnea scale Yes       Intervention Provide education and explanation on how to use Dyspnea scale       Expected Outcomes Short Term: Able to use Dyspnea scale daily in rehab to express subjective sense of shortness of breath during exertion;Long Term: Able to use Dyspnea scale to guide intensity level when exercising independently       Knowledge and understanding of Target Heart Rate Range (THRR) Yes       Intervention Provide education and explanation of THRR including how the numbers were predicted and where they are located for reference       Expected Outcomes Short Term: Able to state/look up THRR;Short Term: Able  to use daily as guideline for intensity in rehab;Long Term: Able to use THRR to govern intensity when exercising independently       Able to check pulse independently Yes       Intervention Provide education and demonstration on how to check pulse in carotid and radial arteries.;Review the importance of being able to check your own pulse for safety during independent exercise       Expected Outcomes Short Term: Able to explain why pulse checking is important during independent exercise;Long Term: Able to check pulse independently and accurately       Understanding of Exercise  Prescription Yes       Intervention Provide education, explanation, and written materials on patient's individual exercise prescription       Expected Outcomes Short Term: Able to explain program exercise prescription;Long Term: Able to explain home exercise prescription to exercise independently              Exercise Goals Re-Evaluation :   Discharge Exercise Prescription (Final Exercise Prescription Changes):  Exercise Prescription Changes - 10/28/19 1300      Response to Exercise   Blood Pressure (Admit) 106/54    Blood Pressure (Exercise) 128/64    Blood Pressure (Exit) 112/64    Heart Rate (Admit) 59 bpm    Heart Rate (Exercise) 85 bpm    Heart Rate (Exit) 63 bpm    Oxygen Saturation (Admit) 100 %    Oxygen Saturation (Exercise) 100 %    Rating of Perceived Exertion (Exercise) 12    Perceived Dyspnea (Exercise) 3    Symptoms none    Comments walk test results           Nutrition:  Target Goals: Understanding of nutrition guidelines, daily intake of sodium '1500mg'$ , cholesterol '200mg'$ , calories 30% from fat and 7% or less from saturated fats, daily to have 5 or more servings of fruits and vegetables.  Education: Controlling Sodium/Reading Food Labels -Group verbal and written material supporting the discussion of sodium use in heart healthy nutrition. Review and explanation with models, verbal and written materials for utilization of the food label.   Education: General Nutrition Guidelines/Fats and Fiber: -Group instruction provided by verbal, written material, models and posters to present the general guidelines for heart healthy nutrition. Gives an explanation and review of dietary fats and fiber.   Cardiac Rehab from 10/28/2019 in [facility] Cardiac and Pulmonary Rehab  Education need identified 10/28/19      Biometrics:  Pre Biometrics - 10/28/19 1349      Pre Biometrics   Height 5' 9.6" (1.768 m)    Weight 177 lb 8 oz (80.5 kg)    BMI (Calculated) 25.76     Single Leg Stand 1.2 seconds            Nutrition Therapy Plan and Nutrition Goals:   Nutrition Assessments:  Nutrition Assessments - 10/28/19 1352      MEDFICTS Scores   Pre Score 66           MEDIFICTS Score Key:          ?70 Need to make dietary changes          40-70 Heart Healthy Diet         ? 40 Therapeutic Level Cholesterol Diet  Nutrition Goals Re-Evaluation:   Nutrition Goals Discharge (Final Nutrition Goals Re-Evaluation):   Psychosocial: Target Goals: Acknowledge presence or absence of significant depression and/or stress, maximize coping skills, provide positive support system. Participant is  able to verbalize types and ability to use techniques and skills needed for reducing stress and depression.   Education: Depression - Provides group verbal and written instruction on the correlation between heart/lung disease and depressed mood, treatment options, and the stigmas associated with seeking treatment.   Cardiac Rehab from 10/28/2019 in [facility] Cardiac and Pulmonary Rehab  Education need identified 10/28/19      Education: Sleep Hygiene -Provides group verbal and written instruction about how sleep can affect your health.  Define sleep hygiene, discuss sleep cycles and impact of sleep habits. Review good sleep hygiene tips.     Education: Stress and Anxiety: - Provides group verbal and written instruction about the health risks of elevated stress and causes of high stress.  Discuss the correlation between heart/lung disease and anxiety and treatment options. Review healthy ways to manage with stress and anxiety.    Initial Review & Psychosocial Screening:  Initial Psych Review & Screening - 10/24/19 1039      Initial Review   Current issues with None Identified      Family Dynamics   Good Support System? Yes    Comments He can look to his wife, son and daughter for support. He has a positve outlook on his health.      Barriers   Psychosocial  barriers to participate in program The patient should benefit from training in stress management and relaxation.;There are no identifiable barriers or psychosocial needs.      Screening Interventions   Interventions To provide support and resources with identified psychosocial needs;Provide feedback about the scores to participant;Encouraged to exercise    Expected Outcomes Short Term goal: Utilizing psychosocial counselor, staff and physician to assist with identification of specific Stressors or current issues interfering with healing process. Setting desired goal for each stressor or current issue identified.;Long Term Goal: Stressors or current issues are controlled or eliminated.;Short Term goal: Identification and review with participant of any Quality of Life or Depression concerns found by scoring the questionnaire.;Long Term goal: The participant improves quality of Life and PHQ9 Scores as seen by post scores and/or verbalization of changes           Quality of Life Scores:   Quality of Life - 10/28/19 1349      Quality of Life   Select Quality of Life      Quality of Life Scores   Health/Function Pre 24.7 %    Socioeconomic Pre 27.21 %    Psych/Spiritual Pre 29.14 %    Family Pre 28.8 %    GLOBAL Pre 26.74 %          Scores of 19 and below usually indicate a poorer quality of life in these areas.  A difference of  2-3 points is a clinically meaningful difference.  A difference of 2-3 points in the total score of the Quality of Life Index has been associated with significant improvement in overall quality of life, self-image, physical symptoms, and general health in studies assessing change in quality of life.  PHQ-9: Recent Review Flowsheet Data    Depression screen [facility] 2/9 10/28/2019   Decreased Interest 1   Down, Depressed, Hopeless 0   PHQ - 2 Score 1   Altered sleeping 0   Tired, decreased energy 2   Change in appetite 1   Feeling bad or failure about yourself  0    Trouble concentrating 0   Moving slowly or fidgety/restless 0   Suicidal thoughts 0   PHQ-9  Score 4   Difficult doing work/chores Not difficult at all     Interpretation of Total Score  Total Score Depression Severity:  1-4 = Minimal depression, 5-9 = Mild depression, 10-14 = Moderate depression, 15-19 = Moderately severe depression, 20-27 = Severe depression   Psychosocial Evaluation and Intervention:  Psychosocial Evaluation - 10/24/19 1040      Psychosocial Evaluation & Interventions   Interventions Encouraged to exercise with the program and follow exercise prescription;Relaxation education;Stress management education    Comments He can look to his wife, son and daughter for support. He has a positve outlook on his health.    Expected Outcomes Short: Exercise regularly to support mental health and notify staff of any changes. Long: maintain mental health and well being through teaching of rehab or prescribed medications independently.    Continue Psychosocial Services  Follow up required by staff           Psychosocial Re-Evaluation:   Psychosocial Discharge (Final Psychosocial Re-Evaluation):   Vocational Rehabilitation: Provide vocational rehab assistance to qualifying candidates.   Vocational Rehab Evaluation & Intervention:   Education: Education Goals: Education classes will be provided on a variety of topics geared toward better understanding of heart health and risk factor modification. Participant will state understanding/return demonstration of topics presented as noted by education test scores.  Learning Barriers/Preferences:  Learning Barriers/Preferences - 10/24/19 1039      Learning Barriers/Preferences   Learning Barriers None    Learning Preferences None           General Cardiac Education Topics:  AED/CPR: - Group verbal and written instruction with the use of models to demonstrate the basic use of the AED with the basic ABC's of  resuscitation.   Anatomy & Physiology of the Heart: - Group verbal and written instruction and models provide basic cardiac anatomy and physiology, with the coronary electrical and arterial systems. Review of Valvular disease and Heart Failure   Cardiac Rehab from 10/28/2019 in [facility] Cardiac and Pulmonary Rehab  Education need identified 10/28/19      Cardiac Procedures: - Group verbal and written instruction to review commonly prescribed medications for heart disease. Reviews the medication, class of the drug, and side effects. Includes the steps to properly store meds and maintain the prescription regimen. (beta blockers and nitrates)   Cardiac Rehab from 10/28/2019 in [facility] Cardiac and Pulmonary Rehab  Education need identified 10/28/19      Cardiac Medications I: - Group verbal and written instruction to review commonly prescribed medications for heart disease. Reviews the medication, class of the drug, and side effects. Includes the steps to properly store meds and maintain the prescription regimen.   Cardiac Medications II: -Group verbal and written instruction to review commonly prescribed medications for heart disease. Reviews the medication, class of the drug, and side effects. (all other drug classes)   Cardiac Rehab from 10/28/2019 in [facility] Cardiac and Pulmonary Rehab  Education need identified 10/28/19       Go Sex-Intimacy & Heart Disease, Get SMART - Goal Setting: - Group verbal and written instruction through game format to discuss heart disease and the return to sexual intimacy. Provides group verbal and written material to discuss and apply goal setting through the application of the S.M.A.R.T. Method.   Cardiac Rehab from 10/28/2019 in [facility] Cardiac and Pulmonary Rehab  Education need identified 10/28/19      Other Matters of the Heart: - Provides group verbal, written materials and models to describe Stable Angina  and Peripheral Artery. Includes description of the  disease process and treatment options available to the cardiac patient.   Infection Prevention: - Provides verbal and written material to individual with discussion of infection control including proper hand washing and proper equipment cleaning during exercise session.   Cardiac Rehab from 10/28/2019 in [facility] Cardiac and Pulmonary Rehab  Date 10/24/19  Educator [facility]  Instruction Review Code 1- Verbalizes Understanding      Falls Prevention: - Provides verbal and written material to individual with discussion of falls prevention and safety.   Cardiac Rehab from 10/28/2019 in [facility] Cardiac and Pulmonary Rehab  Date 10/24/19  Educator [facility]  Instruction Review Code 1- Verbalizes Understanding      Other: -Provides group and verbal instruction on various topics (see comments)   Knowledge Questionnaire Score:  Knowledge Questionnaire Score - 10/28/19 1352      Knowledge Questionnaire Score   Pre Score 21/26 Education Focus: Angina, Depression, Heart Failure, Nutrition, Exercise           Core Components/Risk Factors/Patient Goals at Admission:  Personal Goals and Risk Factors at Admission - 10/28/19 1352      Core Components/Risk Factors/Patient Goals on Admission    Weight Management Yes;Weight Loss    Intervention Weight Management: Develop a combined nutrition and exercise program designed to reach desired caloric intake, while maintaining appropriate intake of nutrient and fiber, sodium and fats, and appropriate energy expenditure required for the weight goal.;Weight Management: Provide education and appropriate resources to help participant work on and attain dietary goals.;Weight Management/Obesity: Establish reasonable short term and long term weight goals.    Admit Weight 177 lb 8 oz (80.5 kg)    Goal Weight: Short Term 172 lb (78 kg)    Goal Weight: Long Term 170 lb (77.1 kg)    Expected Outcomes Short Term: Continue to assess and modify interventions until short term weight  is achieved;Long Term: Adherence to nutrition and physical activity/exercise program aimed toward attainment of established weight goal;Understanding recommendations for meals to include 15-35% energy as protein, 25-35% energy from fat, 35-60% energy from carbohydrates, less than $RemoveB'200mg'JiHpJLVY$  of dietary cholesterol, 20-35 gm of total fiber daily;Understanding of distribution of calorie intake throughout the day with the consumption of 4-5 meals/snacks;Weight Loss: Understanding of general recommendations for a balanced deficit meal plan, which promotes 1-2 lb weight loss per week and includes a negative energy balance of [phone] kcal/d    Hypertension Yes    Intervention Provide education on lifestyle modifcations including regular physical activity/exercise, weight management, moderate sodium restriction and increased consumption of fresh fruit, vegetables, and low fat dairy, alcohol moderation, and smoking cessation.;Monitor prescription use compliance.    Expected Outcomes Short Term: Continued assessment and intervention until BP is < 140/28mm HG in hypertensive participants. < 130/71mm HG in hypertensive participants with diabetes, heart failure or chronic kidney disease.;Long Term: Maintenance of blood pressure at goal levels.    Lipids Yes    Intervention Provide education and support for participant on nutrition & aerobic/resistive exercise along with prescribed medications to achieve LDL '70mg'$ , HDL >$Remo'40mg'hlymg$ .    Expected Outcomes Short Term: Participant states understanding of desired cholesterol values and is compliant with medications prescribed. Participant is following exercise prescription and nutrition guidelines.;Long Term: Cholesterol controlled with medications as prescribed, with individualized exercise RX and with personalized nutrition plan. Value goals: LDL < $Rem'70mg'fHgN$ , HDL > 40 mg.           Education:Diabetes - Individual verbal and written instruction to  review signs/symptoms of diabetes,  desired ranges of glucose level fasting, after meals and with exercise. Acknowledge that pre and post exercise glucose checks will be done for 3 sessions at entry of program.   Education: Know Your Numbers and Risk Factors: -Group verbal and written instruction about important numbers in your health.  Discussion of what are risk factors and how they play a role in the disease process.  Review of Cholesterol, Blood Pressure, Diabetes, and BMI and the role they play in your overall health.   Cardiac Rehab from 10/28/2019 in [facility] Cardiac and Pulmonary Rehab  Education need identified 10/28/19      Core Components/Risk Factors/Patient Goals Review:    Core Components/Risk Factors/Patient Goals at Discharge (Final Review):    ITP Comments:  ITP Comments    Row Name 10/24/19 1038 10/28/19 1158         ITP Comments Virtual Visit completed. Patient informed on EP and RD appointment and 6 Minute walk test. Patient also informed of patient health questionnaires on My Chart. Patient Verbalizes understanding. Visit diagnosis can be found in [facility] 10/16/2019. Completed 6MWT and gym orientation. Initial ITP created and sent for review to Dr. Emily Filbert, Medical Director.             Comments: Initial ITP

## 2019-11-05 ENCOUNTER — Encounter: Payer: Medicare Other | Attending: Cardiology | Admitting: *Deleted

## 2019-11-05 ENCOUNTER — Other Ambulatory Visit: Payer: Self-pay

## 2019-11-05 DIAGNOSIS — Z955 Presence of coronary angioplasty implant and graft: Secondary | ICD-10-CM | POA: Diagnosis present

## 2019-11-05 DIAGNOSIS — I214 Non-ST elevation (NSTEMI) myocardial infarction: Secondary | ICD-10-CM | POA: Diagnosis not present

## 2019-11-05 NOTE — Progress Notes (Signed)
Daily Session Note  Patient Details  Name: Sergio Grimes MRN: [id number] Date of Birth: 04-Mar-1928 Referring Provider:     Cardiac Rehab from 10/28/2019 in [facility]  Referring Provider Isaias Cowman MD      Encounter Date: 11/05/2019  Check In:  Session Check In - 11/05/19 1055      Check-In   Supervising physician immediately available to respond to emergencies See telemetry face sheet for immediately available ER MD    Location [facility]-Cardiac & Pulmonary Rehab    Staff Present Heath Lark, RN, BSN, CCRP;Joseph Hood RCP,RRT,BSRT;Amanda Oletta Darter, [place], ACSM CEP, Exercise Physiologist    Virtual Visit No    Medication changes reported     No    Fall or balance concerns reported    No    Warm-up and Cool-down Performed on first and last piece of equipment    Resistance Training Performed Yes    VAD Patient? No    PAD/SET Patient? No      Pain Assessment   Currently in Pain? No/denies              Social History   Tobacco Use  Smoking Status Former Smoker  . Packs/day: 1.00  . Years: 20.00  . Pack years: 20.00  . Types: Cigarettes  . Quit date: 01/24/1976  . Years since quitting: 43.8  Smokeless Tobacco Former Systems developer    Goals Met:  Exercise tolerated well Personal goals reviewed No report of cardiac concerns or symptoms  Goals Unmet:  Not Applicable  Comments: First full day of exercise!  Patient was oriented to gym and equipment including functions, settings, policies, and procedures.  Patient's individual exercise prescription and treatment plan were reviewed.  All starting workloads were established based on the results of the 6 minute walk test done at initial orientation visit.  The plan for exercise progression was also introduced and progression will be customized based on patient's performance and goals.    Dr. Emily Filbert is Medical Director for [place] [facility].

## 2019-11-06 ENCOUNTER — Encounter: Payer: Self-pay | Admitting: *Deleted

## 2019-11-06 DIAGNOSIS — I214 Non-ST elevation (NSTEMI) myocardial infarction: Secondary | ICD-10-CM

## 2019-11-06 DIAGNOSIS — Z955 Presence of coronary angioplasty implant and graft: Secondary | ICD-10-CM

## 2019-11-06 NOTE — Progress Notes (Signed)
Cardiac Individual Treatment Plan  Patient Details  Name: Sergio Grimes MRN: [id number] Date of Birth: 03-26-1928 Referring Provider:     Cardiac Rehab from 10/28/2019 in [facility] Cardiac and Pulmonary Rehab  Referring Provider Isaias Cowman MD      Initial Encounter Date:    Cardiac Rehab from 10/28/2019 in [facility] Cardiac and Pulmonary Rehab  Date 10/28/19      Visit Diagnosis: NSTEMI (non-ST elevated myocardial infarction) [facility])  Status post coronary artery stent placement  Patient's Home Medications on Admission:  Current Outpatient Medications:  .  aspirin EC 81 MG tablet, Take 81 mg by mouth at bedtime., Disp: , Rfl:  .  clopidogrel (PLAVIX) 75 MG tablet, Take 75 mg by mouth daily at 12 noon. , Disp: , Rfl:  .  finasteride (PROSCAR) 5 MG tablet, Take 5 mg by mouth every evening. , Disp: , Rfl:  .  fluoruracil (CARAC) 0.5 % cream, Apply 1 application topically daily as needed (rash)., Disp: , Rfl:  .  levothyroxine (SYNTHROID, LEVOTHROID) 88 MCG tablet, Take 88 mcg by mouth daily before breakfast., Disp: , Rfl:  .  lisinopril (PRINIVIL,ZESTRIL) 5 MG tablet, Take 5 mg by mouth daily at 12 noon. , Disp: , Rfl:  .  metoprolol succinate (TOPROL-XL) 25 MG 24 hr tablet, Take 25 mg by mouth daily., Disp: , Rfl:  .  Multiple Vitamin (MULTIVITAMIN WITH MINERALS) TABS tablet, Take 1 tablet by mouth daily at 12 noon., Disp: , Rfl:  .  rosuvastatin (CRESTOR) 40 MG tablet, Take 40 mg by mouth at bedtime. , Disp: , Rfl:  .  vitamin B-12 (CYANOCOBALAMIN) 500 MCG tablet, Take 500 mcg by mouth daily. , Disp: , Rfl:   Past Medical History: Past Medical History:  Diagnosis Date  . BPH (benign prostatic hyperplasia)   . Coronary artery disease   . High cholesterol   . Hypertension   . Hypothyroidism     Tobacco Use: Social History   Tobacco Use  Smoking Status Former Smoker  . Packs/day: 1.00  . Years: 20.00  . Pack years: 20.00  . Types: Cigarettes  . Quit date: 01/24/1976  .  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   Exercise Prescription Goal: Initial exercise prescription builds to 30-45 minutes a day of aerobic activity, 2-3 days per week.  Home exercise guidelines will be given to patient during program as part of exercise prescription that the participant will acknowledge.   Education: Aerobic Exercise & Resistance Training: - Gives group verbal and written instruction on the various components of exercise. Focuses on aerobic and resistive training programs and the benefits of this training and how to safely progress through these programs..   Education: Exercise & Equipment Safety: - Individual verbal instruction and demonstration of equipment use and safety with use of the equipment.   Cardiac Rehab from 10/28/2019 in [facility] Cardiac and Pulmonary Rehab  Date 10/24/19  Educator [facility]  Instruction Review Code 1- Verbalizes Understanding  Education: Exercise Physiology & General Exercise Guidelines: - Group verbal and written instruction with models to review the exercise physiology of the cardiovascular system and associated critical values. Provides general exercise guidelines with specific guidelines to those with heart or lung disease.    Cardiac Rehab from 10/28/2019 in [facility] Cardiac and Pulmonary Rehab  Education need identified 10/28/19      Education: Flexibility, Balance, Mind/Body Relaxation: Provides group verbal/written instruction on the benefits of flexibility and balance training, including  mind/body exercise modes such as yoga, pilates and tai chi.  Demonstration and skill practice provided.   Activity Barriers & Risk Stratification:  Activity Barriers & Cardiac Risk Stratification - 10/28/19 1326      Activity Barriers & Cardiac Risk Stratification   Activity Barriers Muscular Weakness;Deconditioning;Balance Concerns;Shortness of Breath;Other (comment)    Comments walks with limp on R side.  Drags right foot some    Cardiac Risk Stratification Moderate           6 Minute Walk:  6 Minute Walk    Row Name 10/28/19 1323         6 Minute Walk   Phase Initial     Distance 880 feet     Walk Time 6 minutes     # of Rest Breaks 0     MPH 1.67     METS 1.12     RPE 12     Perceived Dyspnea  3     VO2 Peak 3.91     Symptoms Yes (comment)     Comments SOB     Resting HR 59 bpm     Resting BP 106/54     Resting Oxygen Saturation  100 %     Exercise Oxygen Saturation  during 6 min walk 100 %     Max Ex. HR 85 bpm     Max Ex. BP 128/64     2 Minute Post BP 112/64            Oxygen Initial Assessment:   Oxygen Re-Evaluation:   Oxygen Discharge (Final Oxygen Re-Evaluation):   Initial Exercise Prescription:  Initial Exercise Prescription - 10/28/19 1300      Date of Initial Exercise RX and Referring Provider   Date 10/28/19    Referring Provider Paraschos, Alexander MD      Treadmill   MPH 1.5    Grade 0.5    Minutes 15    METs 2.15      NuStep   Level 1    SPM 80    Minutes 15    METs 2      T5 Nustep   Level 1    SPM 80    Minutes 15    METs 2      Biostep-RELP   Level 1    SPM 50    Minutes 15    METs 2      Prescription Details   Frequency (times per week) 2    Duration Progress to 30 minutes of continuous aerobic without signs/symptoms of physical distress      Intensity   THRR 40-80% of Max Heartrate 88-117    Ratings of Perceived Exertion 11-13    Perceived Dyspnea 0-4      Progression   Progression Continue to  progress workloads to maintain intensity without signs/symptoms of physical distress.      Resistance Training   Training Prescription Yes    Weight 3 lb    Reps 10-15  Perform Capillary Blood Glucose checks as needed.  Exercise Prescription Changes:  Exercise Prescription Changes    Row Name 10/28/19 1300             Response to Exercise   Blood Pressure (Admit) 106/54       Blood Pressure (Exercise) 128/64       Blood Pressure (Exit) 112/64       Heart Rate (Admit) 59 bpm       Heart Rate (Exercise) 85 bpm       Heart Rate (Exit) 63 bpm       Oxygen Saturation (Admit) 100 %       Oxygen Saturation (Exercise) 100 %       Rating of Perceived Exertion (Exercise) 12       Perceived Dyspnea (Exercise) 3       Symptoms none       Comments walk test results              Exercise Comments:  Exercise Comments    Row Name 11/05/19 1056           Exercise Comments First full day of exercise!  Patient was oriented to gym and equipment including functions, settings, policies, and procedures.  Patient's individual exercise prescription and treatment plan were reviewed.  All starting workloads were established based on the results of the 6 minute walk test done at initial orientation visit.  The plan for exercise progression was also introduced and progression will be customized based on patient's performance and goals.              Exercise Goals and Review:  Exercise Goals    Row Name 10/28/19 1348             Exercise Goals   Increase Physical Activity Yes       Intervention Provide advice, education, support and counseling about physical activity/exercise needs.;Develop an individualized exercise prescription for aerobic and resistive training based on initial evaluation findings, risk stratification, comorbidities and participant's personal goals.       Expected Outcomes Short Term: Attend rehab on a regular basis to increase amount of physical  activity.;Long Term: Add in home exercise to make exercise part of routine and to increase amount of physical activity.;Long Term: Exercising regularly at least 3-5 days a week.       Increase Strength and Stamina Yes       Intervention Provide advice, education, support and counseling about physical activity/exercise needs.;Develop an individualized exercise prescription for aerobic and resistive training based on initial evaluation findings, risk stratification, comorbidities and participant's personal goals.       Expected Outcomes Short Term: Increase workloads from initial exercise prescription for resistance, speed, and METs.;Short Term: Perform resistance training exercises routinely during rehab and add in resistance training at home;Long Term: Improve cardiorespiratory fitness, muscular endurance and strength as measured by increased METs and functional capacity (6MWT)       Able to understand and use rate of perceived exertion (RPE) scale Yes       Intervention Provide education and explanation on how to use RPE scale       Expected Outcomes Long Term:  Able to use RPE to guide intensity level when exercising independently;Short Term: Able to use RPE daily in rehab to express subjective intensity level       Able to understand and use Dyspnea scale Yes       Intervention Provide education and explanation on how to  use Dyspnea scale       Expected Outcomes Short Term: Able to use Dyspnea scale daily in rehab to express subjective sense of shortness of breath during exertion;Long Term: Able to use Dyspnea scale to guide intensity level when exercising independently       Knowledge and understanding of Target Heart Rate Range (THRR) Yes       Intervention Provide education and explanation of THRR including how the numbers were predicted and where they are located for reference       Expected Outcomes Short Term: Able to state/look up THRR;Short Term: Able to use daily as guideline for intensity in  rehab;Long Term: Able to use THRR to govern intensity when exercising independently       Able to check pulse independently Yes       Intervention Provide education and demonstration on how to check pulse in carotid and radial arteries.;Review the importance of being able to check your own pulse for safety during independent exercise       Expected Outcomes Short Term: Able to explain why pulse checking is important during independent exercise;Long Term: Able to check pulse independently and accurately       Understanding of Exercise Prescription Yes       Intervention Provide education, explanation, and written materials on patient's individual exercise prescription       Expected Outcomes Short Term: Able to explain program exercise prescription;Long Term: Able to explain home exercise prescription to exercise independently              Exercise Goals Re-Evaluation :  Exercise Goals Re-Evaluation    Row Name 11/05/19 1056             Exercise Goal Re-Evaluation   Exercise Goals Review Able to understand and use rate of perceived exertion (RPE) scale;Able to understand and use Dyspnea scale;Understanding of Exercise Prescription;Knowledge and understanding of Target Heart Rate Range (THRR)       Comments Reviewed RPE and dyspnea scales, THR and program prescription with pt today.  Pt voiced understanding and was given a copy of goals to take home.       Expected Outcomes Short: Use RPE daily to regulate intensity. Long: Follow program prescription in THR.              Discharge Exercise Prescription (Final Exercise Prescription Changes):  Exercise Prescription Changes - 10/28/19 1300      Response to Exercise   Blood Pressure (Admit) 106/54    Blood Pressure (Exercise) 128/64    Blood Pressure (Exit) 112/64    Heart Rate (Admit) 59 bpm    Heart Rate (Exercise) 85 bpm    Heart Rate (Exit) 63 bpm    Oxygen Saturation (Admit) 100 %    Oxygen Saturation (Exercise) 100 %     Rating of Perceived Exertion (Exercise) 12    Perceived Dyspnea (Exercise) 3    Symptoms none    Comments walk test results           Nutrition:  Target Goals: Understanding of nutrition guidelines, daily intake of sodium <1532m, cholesterol <2042m calories 30% from fat and 7% or less from saturated fats, daily to have 5 or more servings of fruits and vegetables.  Education: Controlling Sodium/Reading Food Labels -Group verbal and written material supporting the discussion of sodium use in heart healthy nutrition. Review and explanation with models, verbal and written materials for utilization of the food label.   Education: General Nutrition Guidelines/Fats and  Fiber: -Group instruction provided by verbal, written material, models and posters to present the general guidelines for heart healthy nutrition. Gives an explanation and review of dietary fats and fiber.   Cardiac Rehab from 10/28/2019 in [facility] Cardiac and Pulmonary Rehab  Education need identified 10/28/19      Biometrics:  Pre Biometrics - 10/28/19 1349      Pre Biometrics   Height 5' 9.6" (1.768 m)    Weight 177 lb 8 oz (80.5 kg)    BMI (Calculated) 25.76    Single Leg Stand 1.2 seconds            Nutrition Therapy Plan and Nutrition Goals:   Nutrition Assessments:  Nutrition Assessments - 10/28/19 1352      MEDFICTS Scores   Pre Score 66           MEDIFICTS Score Key:          ?70 Need to make dietary changes          40-70 Heart Healthy Diet         ? 40 Therapeutic Level Cholesterol Diet  Nutrition Goals Re-Evaluation:   Nutrition Goals Discharge (Final Nutrition Goals Re-Evaluation):   Psychosocial: Target Goals: Acknowledge presence or absence of significant depression and/or stress, maximize coping skills, provide positive support system. Participant is able to verbalize types and ability to use techniques and skills needed for reducing stress and depression.   Education:  Depression - Provides group verbal and written instruction on the correlation between heart/lung disease and depressed mood, treatment options, and the stigmas associated with seeking treatment.   Cardiac Rehab from 10/28/2019 in [facility] Cardiac and Pulmonary Rehab  Education need identified 10/28/19      Education: Sleep Hygiene -Provides group verbal and written instruction about how sleep can affect your health.  Define sleep hygiene, discuss sleep cycles and impact of sleep habits. Review good sleep hygiene tips.     Education: Stress and Anxiety: - Provides group verbal and written instruction about the health risks of elevated stress and causes of high stress.  Discuss the correlation between heart/lung disease and anxiety and treatment options. Review healthy ways to manage with stress and anxiety.    Initial Review & Psychosocial Screening:  Initial Psych Review & Screening - 10/24/19 1039      Initial Review   Current issues with None Identified      Family Dynamics   Good Support System? Yes    Comments He can look to his wife, son and daughter for support. He has a positve outlook on his health.      Barriers   Psychosocial barriers to participate in program The patient should benefit from training in stress management and relaxation.;There are no identifiable barriers or psychosocial needs.      Screening Interventions   Interventions To provide support and resources with identified psychosocial needs;Provide feedback about the scores to participant;Encouraged to exercise    Expected Outcomes Short Term goal: Utilizing psychosocial counselor, staff and physician to assist with identification of specific Stressors or current issues interfering with healing process. Setting desired goal for each stressor or current issue identified.;Long Term Goal: Stressors or current issues are controlled or eliminated.;Short Term goal: Identification and review with participant of any  Quality of Life or Depression concerns found by scoring the questionnaire.;Long Term goal: The participant improves quality of Life and PHQ9 Scores as seen by post scores and/or verbalization of changes  Quality of Life Scores:   Quality of Life - 10/28/19 1349      Quality of Life   Select Quality of Life      Quality of Life Scores   Health/Function Pre 24.7 %    Socioeconomic Pre 27.21 %    Psych/Spiritual Pre 29.14 %    Family Pre 28.8 %    GLOBAL Pre 26.74 %          Scores of 19 and below usually indicate a poorer quality of life in these areas.  A difference of  2-3 points is a clinically meaningful difference.  A difference of 2-3 points in the total score of the Quality of Life Index has been associated with significant improvement in overall quality of life, self-image, physical symptoms, and general health in studies assessing change in quality of life.  PHQ-9: Recent Review Flowsheet Data    Depression screen [facility] 2/9 10/28/2019   Decreased Interest 1   Down, Depressed, Hopeless 0   PHQ - 2 Score 1   Altered sleeping 0   Tired, decreased energy 2   Change in appetite 1   Feeling bad or failure about yourself  0   Trouble concentrating 0   Moving slowly or fidgety/restless 0   Suicidal thoughts 0   PHQ-9 Score 4   Difficult doing work/chores Not difficult at all     Interpretation of Total Score  Total Score Depression Severity:  1-4 = Minimal depression, 5-9 = Mild depression, 10-14 = Moderate depression, 15-19 = Moderately severe depression, 20-27 = Severe depression   Psychosocial Evaluation and Intervention:  Psychosocial Evaluation - 10/24/19 1040      Psychosocial Evaluation & Interventions   Interventions Encouraged to exercise with the program and follow exercise prescription;Relaxation education;Stress management education    Comments He can look to his wife, son and daughter for support. He has a positve outlook on his health.     Expected Outcomes Short: Exercise regularly to support mental health and notify staff of any changes. Long: maintain mental health and well being through teaching of rehab or prescribed medications independently.    Continue Psychosocial Services  Follow up required by staff           Psychosocial Re-Evaluation:   Psychosocial Discharge (Final Psychosocial Re-Evaluation):   Vocational Rehabilitation: Provide vocational rehab assistance to qualifying candidates.   Vocational Rehab Evaluation & Intervention:   Education: Education Goals: Education classes will be provided on a variety of topics geared toward better understanding of heart health and risk factor modification. Participant will state understanding/return demonstration of topics presented as noted by education test scores.  Learning Barriers/Preferences:  Learning Barriers/Preferences - 10/24/19 1039      Learning Barriers/Preferences   Learning Barriers None    Learning Preferences None           General Cardiac Education Topics:  AED/CPR: - Group verbal and written instruction with the use of models to demonstrate the basic use of the AED with the basic ABC's of resuscitation.   Anatomy & Physiology of the Heart: - Group verbal and written instruction and models provide basic cardiac anatomy and physiology, with the coronary electrical and arterial systems. Review of Valvular disease and Heart Failure   Cardiac Rehab from 10/28/2019 in [facility] Cardiac and Pulmonary Rehab  Education need identified 10/28/19      Cardiac Procedures: - Group verbal and written instruction to review commonly prescribed medications for heart disease. Reviews the medication, class  of the drug, and side effects. Includes the steps to properly store meds and maintain the prescription regimen. (beta blockers and nitrates)   Cardiac Rehab from 10/28/2019 in [facility] Cardiac and Pulmonary Rehab  Education need identified 10/28/19       Cardiac Medications I: - Group verbal and written instruction to review commonly prescribed medications for heart disease. Reviews the medication, class of the drug, and side effects. Includes the steps to properly store meds and maintain the prescription regimen.   Cardiac Medications II: -Group verbal and written instruction to review commonly prescribed medications for heart disease. Reviews the medication, class of the drug, and side effects. (all other drug classes)   Cardiac Rehab from 10/28/2019 in [facility] Cardiac and Pulmonary Rehab  Education need identified 10/28/19       Go Sex-Intimacy & Heart Disease, Get SMART - Goal Setting: - Group verbal and written instruction through game format to discuss heart disease and the return to sexual intimacy. Provides group verbal and written material to discuss and apply goal setting through the application of the S.M.A.R.T. Method.   Cardiac Rehab from 10/28/2019 in [facility] Cardiac and Pulmonary Rehab  Education need identified 10/28/19      Other Matters of the Heart: - Provides group verbal, written materials and models to describe Stable Angina and Peripheral Artery. Includes description of the disease process and treatment options available to the cardiac patient.   Infection Prevention: - Provides verbal and written material to individual with discussion of infection control including proper hand washing and proper equipment cleaning during exercise session.   Cardiac Rehab from 10/28/2019 in [facility] Cardiac and Pulmonary Rehab  Date 10/24/19  Educator [facility]  Instruction Review Code 1- Verbalizes Understanding      Falls Prevention: - Provides verbal and written material to individual with discussion of falls prevention and safety.   Cardiac Rehab from 10/28/2019 in [facility] Cardiac and Pulmonary Rehab  Date 10/24/19  Educator [facility]  Instruction Review Code 1- Verbalizes Understanding      Other: -Provides group and verbal  instruction on various topics (see comments)   Knowledge Questionnaire Score:  Knowledge Questionnaire Score - 10/28/19 1352      Knowledge Questionnaire Score   Pre Score 21/26 Education Focus: Angina, Depression, Heart Failure, Nutrition, Exercise           Core Components/Risk Factors/Patient Goals at Admission:  Personal Goals and Risk Factors at Admission - 10/28/19 1352      Core Components/Risk Factors/Patient Goals on Admission    Weight Management Yes;Weight Loss    Intervention Weight Management: Develop a combined nutrition and exercise program designed to reach desired caloric intake, while maintaining appropriate intake of nutrient and fiber, sodium and fats, and appropriate energy expenditure required for the weight goal.;Weight Management: Provide education and appropriate resources to help participant work on and attain dietary goals.;Weight Management/Obesity: Establish reasonable short term and long term weight goals.    Admit Weight 177 lb 8 oz (80.5 kg)    Goal Weight: Short Term 172 lb (78 kg)    Goal Weight: Long Term 170 lb (77.1 kg)    Expected Outcomes Short Term: Continue to assess and modify interventions until short term weight is achieved;Long Term: Adherence to nutrition and physical activity/exercise program aimed toward attainment of established weight goal;Understanding recommendations for meals to include 15-35% energy as protein, 25-35% energy from fat, 35-60% energy from carbohydrates, less than 242m of dietary cholesterol, 20-35 gm of total fiber daily;Understanding of distribution  of calorie intake throughout the day with the consumption of 4-5 meals/snacks;Weight Loss: Understanding of general recommendations for a balanced deficit meal plan, which promotes 1-2 lb weight loss per week and includes a negative energy balance of [phone] kcal/d    Hypertension Yes    Intervention Provide education on lifestyle modifcations including regular physical  activity/exercise, weight management, moderate sodium restriction and increased consumption of fresh fruit, vegetables, and low fat dairy, alcohol moderation, and smoking cessation.;Monitor prescription use compliance.    Expected Outcomes Short Term: Continued assessment and intervention until BP is < 140/13m HG in hypertensive participants. < 130/838mHG in hypertensive participants with diabetes, heart failure or chronic kidney disease.;Long Term: Maintenance of blood pressure at goal levels.    Lipids Yes    Intervention Provide education and support for participant on nutrition & aerobic/resistive exercise along with prescribed medications to achieve LDL <7054mHDL >10m42m  Expected Outcomes Short Term: Participant states understanding of desired cholesterol values and is compliant with medications prescribed. Participant is following exercise prescription and nutrition guidelines.;Long Term: Cholesterol controlled with medications as prescribed, with individualized exercise RX and with personalized nutrition plan. Value goals: LDL < 70mg62mL > 40 mg.           Education:Diabetes - Individual verbal and written instruction to review signs/symptoms of diabetes, desired ranges of glucose level fasting, after meals and with exercise. Acknowledge that pre and post exercise glucose checks will be done for 3 sessions at entry of program.   Education: Know Your Numbers and Risk Factors: -Group verbal and written instruction about important numbers in your health.  Discussion of what are risk factors and how they play a role in the disease process.  Review of Cholesterol, Blood Pressure, Diabetes, and BMI and the role they play in your overall health.   [facility]  Education need identified 10/28/19      Core Components/Risk Factors/Patient Goals Review:    Core Components/Risk Factors/Patient Goals at Discharge (Final Review):    ITP  Comments:  ITP Comments    Row Name 10/24/19 1038 10/28/19 1158 11/05/19 1056 11/06/19 0646     ITP Comments Virtual Visit completed. Patient informed on EP and RD appointment and 6 Minute walk test. Patient also informed of patient health questionnaires on My Chart. Patient Verbalizes understanding. Visit diagnosis can be found [facility]. Completed 6MWT and gym orientation. Initial ITP created and sent for review to Dr. Mark Emily Filbertical Director. First full day of exercise!  Patient was oriented to gym and equipment including functions, settings, policies, and procedures.  Patient's individual exercise prescription and treatment plan were reviewed.  All starting workloads were established based on the results of the 6 minute walk test done at initial orientation visit.  The plan for exercise progression was also introduced and progression will be customized based on patient's performance and goals. 30 Day review completed. Medical Director ITP review done, changes made as directed, and signed approval by Medical Director.           Comments:

## 2019-11-07 ENCOUNTER — Encounter: Payer: Medicare Other | Admitting: *Deleted

## 2019-11-07 ENCOUNTER — Other Ambulatory Visit: Payer: Self-pay

## 2019-11-07 DIAGNOSIS — I214 Non-ST elevation (NSTEMI) myocardial infarction: Secondary | ICD-10-CM | POA: Diagnosis not present

## 2019-11-07 DIAGNOSIS — Z955 Presence of coronary angioplasty implant and graft: Secondary | ICD-10-CM

## 2019-11-07 NOTE — Progress Notes (Signed)
Daily Session Note  Patient Details  Name: ORVILLE MENA MRN: [id number] Date of Birth: 12/25/1928 Referring Provider:     Cardiac Rehab from 10/28/2019 in [facility] Cardiac and Pulmonary Rehab  Referring Provider Isaias Cowman MD      Encounter Date: 11/07/2019  Check In:  Session Check In - 11/07/19 1101      Check-In   Supervising physician immediately available to respond to emergencies See telemetry face sheet for immediately available ER MD    Location [facility]-Cardiac & Pulmonary Rehab    Staff Present Hope Budds RDN, LDN;Desta Bujak Sherryll Burger, RN Vickki Hearing, BA, ACSM CEP, Exercise Physiologist;Jessica Luan Pulling, MA, RCEP, CCRP, CCET    Virtual Visit No    Medication changes reported     No    Fall or balance concerns reported    No    Warm-up and Cool-down Performed on first and last piece of equipment    Resistance Training Performed Yes    VAD Patient? No    PAD/SET Patient? No      Pain Assessment   Currently in Pain? No/denies              Social History   Tobacco Use  Smoking Status Former Smoker  . Packs/day: 1.00  . Years: 20.00  . Pack years: 20.00  . Types: Cigarettes  . Quit date: 01/24/1976  . Years since quitting: 43.8  Smokeless Tobacco Former Systems developer    Goals Met:  Independence with exercise equipment Exercise tolerated well No report of cardiac concerns or symptoms Strength training completed today  Goals Unmet:  Not Applicable  Comments: Pt able to follow exercise prescription today without complaint.  Will continue to monitor for progression.    Dr. Emily Filbert is Medical Director for [place] and [facility].

## 2019-11-12 ENCOUNTER — Other Ambulatory Visit: Payer: Self-pay

## 2019-11-12 ENCOUNTER — Encounter: Payer: Medicare Other | Admitting: *Deleted

## 2019-11-12 DIAGNOSIS — I214 Non-ST elevation (NSTEMI) myocardial infarction: Secondary | ICD-10-CM | POA: Diagnosis not present

## 2019-11-12 NOTE — Progress Notes (Signed)
Daily Session Note  Patient Details  Name: LYNDA CAPISTRAN MRN: [id number] Date of Birth: 1928/06/13 Referring Provider:     Cardiac Rehab from 10/28/2019 in [facility] Cardiac and Pulmonary Rehab  Referring Provider Isaias Cowman MD      Encounter Date: 11/12/2019  Check In:  Session Check In - 11/12/19 1017      Check-In   Supervising physician immediately available to respond to emergencies See telemetry face sheet for immediately available ER MD    Location [facility]-Card[facility] & Pulmonary Rehab    Staff Present Heath Lark, RN, BSN, Jacklynn Bue, MS Exercise Physiologist;Amanda Oletta Darter, [place], ACSM CEP, Exercise Physiologist    Virtual Visit No    Medication changes reported     No    Fall or balance concerns reported    No    Warm-up and Cool-down Performed on first and last piece of equipment    Resistance Training Performed Yes    VAD Patient? No    PAD/SET Patient? No      Pain Assessment   Currently in Pain? No/denies              Social History   Tobacco Use  Smoking Status Former Smoker  . Packs/day: 1.00  . Years: 20.00  . Pack years: 20.00  . Types: Cigarettes  . Quit date: 01/24/1976  . Years since quitting: 43.8  Smokeless Tobacco Former Systems developer    Goals Met:  Exercise tolerated well No report of cardiac concerns or symptoms  Goals Unmet:  Not Applicable  Comments: Pt able to follow exercise prescription today without complaint.  Will continue to monitor for progression.    Dr. Emily Filbert is Medical Director for [place] and [facility].

## 2019-11-14 ENCOUNTER — Other Ambulatory Visit: Payer: Self-pay

## 2019-11-14 DIAGNOSIS — Z955 Presence of coronary angioplasty implant and graft: Secondary | ICD-10-CM

## 2019-11-14 DIAGNOSIS — I214 Non-ST elevation (NSTEMI) myocardial infarction: Secondary | ICD-10-CM | POA: Diagnosis not present

## 2019-11-14 NOTE — Progress Notes (Signed)
Daily Session Note  Patient Details  Name: Sergio Grimes MRN: [id number] Date of Birth: 1928-05-19 Referring Provider:     Cardiac Rehab from 10/28/2019 in [facility] Cardiac and Pulmonary Rehab  Referring Provider Isaias Cowman MD      Encounter Date: 11/14/2019  Check In:  Session Check In - 11/14/19 0959      Check-In   Supervising physician immediately available to respond to emergencies See telemetry face sheet for immediately available ER MD    Location [facility]-Cardiac & Pulmonary Rehab    Staff Present Birdie Sons, MPA, Elveria Rising, BA, ACSM CEP, Exercise Physiologist;Kara Eliezer Bottom, MS Exercise Physiologist    Virtual Visit No    Medication changes reported     No    Fall or balance concerns reported    No    Warm-up and Cool-down Performed on first and last piece of equipment    Resistance Training Performed Yes    VAD Patient? No    PAD/SET Patient? No      Pain Assessment   Currently in Pain? No/denies              Social History   Tobacco Use  Smoking Status Former Smoker  . Packs/day: 1.00  . Years: 20.00  . Pack years: 20.00  . Types: Cigarettes  . Quit date: 01/24/1976  . Years since quitting: 43.8  Smokeless Tobacco Former Systems developer    Goals Met:  Independence with exercise equipment Exercise tolerated well No report of cardiac concerns or symptoms Strength training completed today  Goals Unmet:  Not Applicable  Comments: Pt able to follow exercise prescription today without complaint.  Will continue to monitor for progression.    Dr. Emily Filbert is Medical Director for [place] and [facility].

## 2019-11-19 ENCOUNTER — Encounter: Payer: Medicare Other | Admitting: *Deleted

## 2019-11-19 ENCOUNTER — Other Ambulatory Visit: Payer: Self-pay

## 2019-11-19 DIAGNOSIS — I214 Non-ST elevation (NSTEMI) myocardial infarction: Secondary | ICD-10-CM | POA: Diagnosis not present

## 2019-11-19 NOTE — Progress Notes (Signed)
Daily Session Note  Patient Details  Name: DAIRE OKIMOTO MRN: [id number] Date of Birth: 1928/09/16 Referring Provider:     Cardiac Rehab from 10/28/2019 in [facility] Cardiac and Pulmonary Rehab  Referring Provider Isaias Cowman MD      Encounter Date: 11/19/2019  Check In:  Session Check In - 11/19/19 1008      Check-In   Supervising physician immediately available to respond to emergencies See telemetry face sheet for immediately available ER MD    Location [facility]-Card[facility] Pul[facility] Rehab    Staff Present Heath Lark, RN, BSN, CCRP;Amanda Sommer, BA, ACSM CEP, Exercise Physiologist;Kara Eliezer Bottom, MS Exercise Physiologist    Virtual Visit No    Medication changes reported     No    Fall or balance concerns reported    No    Warm-up and Cool-down Performed on first and last piece of equipment    Resistance Training Performed Yes    VAD Patient? No    PAD/SET Patient? No      Pain Assessment   Currently in Pain? No/denies              Social History   Tobacco Use  Smoking Status Former Smoker  . Packs/day: 1.00  . Years: 20.00  . Pack years: 20.00  . Types: Cigarettes  . Quit date: 01/24/1976  . Years since quitting: 43.8  Smokeless Tobacco Former Systems developer    Goals Met:  Independence with exercise equipment Exercise tolerated well No report of cardiac concerns or symptoms  Goals Unmet:  Not Applicable  Comments: Pt able to follow exercise prescription today without complaint.  Will continue to monitor for progression.    Dr. Emily Filbert is Medical Director for [place] and [facility].

## 2019-11-21 ENCOUNTER — Other Ambulatory Visit: Payer: Self-pay

## 2019-11-21 DIAGNOSIS — I214 Non-ST elevation (NSTEMI) myocardial infarction: Secondary | ICD-10-CM | POA: Diagnosis not present

## 2019-11-21 DIAGNOSIS — Z955 Presence of coronary angioplasty implant and graft: Secondary | ICD-10-CM

## 2019-11-21 NOTE — Progress Notes (Signed)
Daily Session Note  Patient Details  Name: TREV BOLEY MRN: [id number] Date of Birth: [birth year] Referring Provider:     Cardiac Rehab from 10/28/2019 in [facility] Cardiac and Pulmonary Rehab  Referring Provider Isaias Cowman MD      Encounter Date: 11/21/2019  Check In:  Session Check In - 11/21/19 0952      Check-In   Supervising physician immediately available to respond to emergencies See telemetry face sheet for immediately available ER MD    Location [facility]-Cardiac & Pulmonary Rehab    Staff Present Birdie Sons, MPA, RN;Melissa Caiola RDN, Rowe Pavy, BA, ACSM CEP, Exercise Physiologist    Virtual Visit No    Medication changes reported     No    Fall or balance concerns reported    No    Warm-up and Cool-down Performed on first and last piece of equipment    VAD Patient? No    PAD/SET Patient? No      Pain Assessment   Currently in Pain? No/denies              Social History   Tobacco Use  Smoking Status Former Smoker  . Packs/day: 1.00  . Years: 20.00  . Pack years: 20.00  . Types: Cigarettes  . Quit date: 01/24/1976  . Years since quitting: 43.8  Smokeless Tobacco Former Systems developer    Goals Met:  Independence with exercise equipment Exercise tolerated well No report of cardiac concerns or symptoms Strength training completed today  Goals Unmet:  Not Applicable  Comments: Pt able to follow exercise prescription today without complaint.  Will continue to monitor for progression.    Dr. Emily Filbert is Medical Director for [place] and [facility].

## 2019-11-26 ENCOUNTER — Other Ambulatory Visit: Payer: Self-pay

## 2019-11-26 ENCOUNTER — Encounter: Payer: Medicare Other | Admitting: *Deleted

## 2019-11-26 DIAGNOSIS — I214 Non-ST elevation (NSTEMI) myocardial infarction: Secondary | ICD-10-CM | POA: Diagnosis not present

## 2019-11-26 NOTE — Progress Notes (Signed)
Daily Session Note  Patient Details  Name: Sergio Grimes MRN: [id number] Date of Birth: 1928/03/04 Referring Provider:     Cardiac Rehab from 10/28/2019 in [facility] Cardiac and Pulmonary Rehab  Referring Provider Isaias Cowman MD      Encounter Date: 11/26/2019  Check In:  Session Check In - 11/26/19 1122      Check-In   Supervising physician immediately available to respond to emergencies See telemetry face sheet for immediately available ER MD    Location [facility]-Cardiac & Pulmonary Rehab    Staff Present Heath Lark, RN, BSN, Jacklynn Bue, MS Exercise Physiologist;Amanda Oletta Darter, [place], ACSM CEP, Exercise Physiologist    Virtual Visit No    Medication changes reported     No    Fall or balance concerns reported    No    Warm-up and Cool-down Performed on first and last piece of equipment    Resistance Training Performed Yes    VAD Patient? No    PAD/SET Patient? No      Pain Assessment   Currently in Pain? No/denies              Social History   Tobacco Use  Smoking Status Former Smoker  . Packs/day: 1.00  . Years: 20.00  . Pack years: 20.00  . Types: Cigarettes  . Quit date: 01/24/1976  . Years since quitting: 43.8  Smokeless Tobacco Former Systems developer    Goals Met:  Independence with exercise equipment Exercise tolerated well No report of cardiac concerns or symptoms  Goals Unmet:  Not Applicable  Comments: Pt able to follow exercise prescription today without complaint.  Will continue to monitor for progression.    Dr. Emily Filbert is Medical Director for [place] and [facility].

## 2019-12-04 ENCOUNTER — Encounter: Payer: Self-pay | Admitting: *Deleted

## 2019-12-04 DIAGNOSIS — I214 Non-ST elevation (NSTEMI) myocardial infarction: Secondary | ICD-10-CM

## 2019-12-04 NOTE — Progress Notes (Signed)
Cardiac Individual Treatment Plan  Patient Details  Name: Sergio Grimes MRN: [id number] Date of Birth: January 25, 1928 Referring Provider:     Cardiac Rehab from 10/28/2019 in [facility] Cardiac and Pulmonary Rehab  Referring Provider Marcina Millard MD      Initial Encounter Date:    Cardiac Rehab from 10/28/2019 in [facility] Cardiac and Pulmonary Rehab  Date 10/28/19      Visit Diagnosis: NSTEMI (non-ST elevated myocardial infarction) [facility])  Patient's Home Medications on Admission:  Current Outpatient Medications:  .  aspirin EC 81 MG tablet, Take 81 mg by mouth at bedtime., Disp: , Rfl:  .  clopidogrel (PLAVIX) 75 MG tablet, Take 75 mg by mouth daily at 12 noon. , Disp: , Rfl:  .  finasteride (PROSCAR) 5 MG tablet, Take 5 mg by mouth every evening. , Disp: , Rfl:  .  fluoruracil (CARAC) 0.5 % cream, Apply 1 application topically daily as needed (rash)., Disp: , Rfl:  .  levothyroxine (SYNTHROID, LEVOTHROID) 88 MCG tablet, Take 88 mcg by mouth daily before breakfast., Disp: , Rfl:  .  lisinopril (PRINIVIL,ZESTRIL) 5 MG tablet, Take 5 mg by mouth daily at 12 noon. , Disp: , Rfl:  .  metoprolol succinate (TOPROL-XL) 25 MG 24 hr tablet, Take 25 mg by mouth daily., Disp: , Rfl:  .  Multiple Vitamin (MULTIVITAMIN WITH MINERALS) TABS tablet, Take 1 tablet by mouth daily at 12 noon., Disp: , Rfl:  .  rosuvastatin (CRESTOR) 40 MG tablet, Take 40 mg by mouth at bedtime. , Disp: , Rfl:  .  vitamin B-12 (CYANOCOBALAMIN) 500 MCG tablet, Take 500 mcg by mouth daily. , Disp: , Rfl:   Past Medical History: Past Medical History:  Diagnosis Date  . BPH (benign prostatic hyperplasia)   . Coronary artery disease   . High cholesterol   . Hypertension   . Hypothyroidism     Tobacco Use: Social History   Tobacco Use  Smoking Status Former Smoker  . Packs/day: 1.00  . Years: 20.00  . Pack years: 20.00  . Types: Cigarettes  . Quit date: 01/24/1976  . Years since quitting: 43.8  Smokeless Tobacco  Former Emergency planning/management officer: Recent Airline pilot for [employer] Cardiac and Pulmonary Rehab Latest Ref Rng & Units 10/17/2019   Cholestrol 0 - 200 mg/dL 397   LDLCALC 0 - 99 mg/dL 44   HDL >67 mg/dL 48   Trlycerides <341 mg/dL 42       Exercise Target Goals: Exercise Program Goal: Individual exercise prescription set using results from initial 6 min walk test and THRR while considering  patient's activity barriers and safety.   Exercise Prescription Goal: Initial exercise prescription builds to 30-45 minutes a day of aerobic activity, 2-3 days per week.  Home exercise guidelines will be given to patient during program as part of exercise prescription that the participant will acknowledge.   Education: Aerobic Exercise: - Group verbal and visual presentation on the components of exercise prescription. Introduces F.I.T.T principle from ACSM for exercise prescriptions.  Reviews F.I.T.T. principles of aerobic exercise including progression. Written material given at graduation.   Education: Resistance Exercise: - Group verbal and visual presentation on the components of exercise prescription. Introduces F.I.T.T principle from ACSM for exercise prescriptions  Reviews F.I.T.T. principles of resistance exercise including progression. Written material given at graduation.    Education: Exercise & Equipment Safety: - Individual verbal instruction and demonstration of equipment use and safety with use of  the equipment.   Card[facility] Card[facility]  Date 10/24/19  [facility]  Instruction Review Code 1- Verbalizes Understanding      Education: Exercise Physiology & General Exercise Guidelines: - Group verbal and written instruction with models to review the exercise physiology of the cardiovascular system and associated critical values. Provides general exercise guidelines with specific guidelines to those with heart or lung disease.    Card[facility]  [facility]  Education need identified 10/28/19      Education: Flexibility, Balance, Mind/Body Relaxation: - Group verbal and visual presentation with interactive activity on the components of exercise prescription. Introduces F.I.T.T principle from ACSM for exercise prescriptions. Reviews F.I.T.T. principles of flexibility and balance exercise training including progression. Also discusses the mind body connection.  Reviews various relaxation techniques to help reduce and manage stress (i.e. Deep breathing, progressive muscle relaxation, and visualization). Balance handout provided to take home. Written material given at graduation.   Activity Barriers & Risk Stratification:  Activity Barriers & Cardiac Risk Stratification - 10/28/19 1326      Activity Barriers & Cardiac Risk Stratification   Activity Barriers Muscular Weakness;Deconditioning;Balance Concerns;Shortness of Breath;Other (comment)    Comments walks with limp on R side.  Drags right foot some    Cardiac Risk Stratification Moderate           6 Minute Walk:  6 Minute Walk    Row Name 10/28/19 1323         6 Minute Walk   Phase Initial     Distance 880 feet     Walk Time 6 minutes     # of Rest Breaks 0     MPH 1.67     METS 1.12     RPE 12     Perceived Dyspnea  3     VO2 Peak 3.91     Symptoms Yes (comment)     Comments SOB     Resting HR 59 bpm     Resting BP 106/54     Resting Oxygen Saturation  100 %     Exercise Oxygen Saturation  during 6 min walk 100 %     Max Ex. HR 85 bpm     Max Ex. BP 128/64     2 Minute Post BP 112/64            Oxygen Initial Assessment:   Oxygen Re-Evaluation:   Oxygen Discharge (Final Oxygen Re-Evaluation):   Initial Exercise Prescription:  Initial Exercise Prescription - 10/28/19 1300      Date of Initial Exercise RX and Referring Provider   Date 10/28/19    Referring Provider Paraschos, Alexander MD       Treadmill   MPH 1.5    Grade 0.5    Minutes 15    METs 2.15      NuStep   Level 1    SPM 80    Minutes 15    METs 2      T5 Nustep   Level 1    SPM 80    Minutes 15    METs 2      Biostep-RELP   Level 1    SPM 50    Minutes 15    METs 2      Prescription Details   Frequency (times per week) 2    Duration Progress to 30 minutes of continuous aerobic without signs/symptoms of physical  distress      Intensity   THRR 40-80% of Max Heartrate 88-117    Ratings of Perceived Exertion 11-13    Perceived Dyspnea 0-4      Progression   Progression Continue to progress workloads to maintain intensity without signs/symptoms of physical distress.      Resistance Training   Training Prescription Yes    Weight 3 lb    Reps 10-15           Perform Capillary Blood Glucose checks as needed.  Exercise Prescription Changes:  Exercise Prescription Changes    Row Name 10/28/19 1300 11/13/19 1500 11/25/19 1700         Response to Exercise   Blood Pressure (Admit) 106/54 132/62 132/58     Blood Pressure (Exercise) 128/64 140/60 130/58     Blood Pressure (Exit) 112/64 112/50 124/64     Heart Rate (Admit) 59 bpm 66 bpm 74 bpm     Heart Rate (Exercise) 85 bpm 79 bpm 84 bpm     Heart Rate (Exit) 63 bpm 69 bpm 70 bpm     Oxygen Saturation (Admit) 100 % -- --     Oxygen Saturation (Exercise) 100 % -- --     Rating of Perceived Exertion (Exercise) 12 12 11      Perceived Dyspnea (Exercise) 3 -- --     Symptoms none none none     Comments walk test results -- --     Duration -- Progress to 30 minutes of  aerobic without signs/symptoms of physical distress Progress to 30 minutes of  aerobic without signs/symptoms of physical distress     Intensity -- THRR unchanged THRR unchanged       Progression   Progression -- Continue to progress workloads to maintain intensity without signs/symptoms of physical distress. Continue to progress workloads to maintain intensity without  signs/symptoms of physical distress.     Average METs -- 2.35 2.53       Resistance Training   Training Prescription -- Yes Yes     Weight -- 3 lb 3 lb     Reps -- 10-15 10-15       Interval Training   Interval Training -- No No       Recumbant Bike   Level -- 1 1     Watts -- 15 --     Minutes -- 15 15     METs -- 2.59 2.96       NuStep   Level -- 1 --     Minutes -- 15 --     METs -- 2.7 --       Arm Ergometer   Level -- 1 1     Minutes -- 15 15     METs -- 2.1 2.1       Biostep-RELP   Level -- 1 --     Minutes -- 15 --     METs -- 2 --            Exercise Comments:  Exercise Comments    Row Name 11/05/19 1056           Exercise Comments First full day of exercise!  Patient was oriented to gym and equipment including functions, settings, policies, and procedures.  Patient's individual exercise prescription and treatment plan were reviewed.  All starting workloads were established based on the results of the 6 minute walk test done at initial orientation visit.  The plan for exercise progression was  also introduced and progression will be customized based on patient's performance and goals.              Exercise Goals and Review:  Exercise Goals    Row Name 10/28/19 1348             Exercise Goals   Increase Physical Activity Yes       Intervention Provide advice, education, support and counseling about physical activity/exercise needs.;Develop an individualized exercise prescription for aerobic and resistive training based on initial evaluation findings, risk stratification, comorbidities and participant's personal goals.       Expected Outcomes Short Term: Attend rehab on a regular basis to increase amount of physical activity.;Long Term: Add in home exercise to make exercise part of routine and to increase amount of physical activity.;Long Term: Exercising regularly at least 3-5 days a week.       Increase Strength and Stamina Yes       Intervention  Provide advice, education, support and counseling about physical activity/exercise needs.;Develop an individualized exercise prescription for aerobic and resistive training based on initial evaluation findings, risk stratification, comorbidities and participant's personal goals.       Expected Outcomes Short Term: Increase workloads from initial exercise prescription for resistance, speed, and METs.;Short Term: Perform resistance training exercises routinely during rehab and add in resistance training at home;Long Term: Improve cardiorespiratory fitness, muscular endurance and strength as measured by increased METs and functional capacity ( )       Able to understand and use rate of perceived exertion (RPE) scale Yes       Intervention Provide education and explanation on how to use RPE scale       Expected Outcomes Long Term:  Able to use RPE to guide intensity level when exercising independently;Short Term: Able to use RPE daily in rehab to express subjective intensity level       Able to understand and use Dyspnea scale Yes       Intervention Provide education and explanation on how to use Dyspnea scale       Expected Outcomes Short Term: Able to use Dyspnea scale daily in rehab to express subjective sense of shortness of breath during exertion;Long Term: Able to use Dyspnea scale to guide intensity level when exercising independently       Knowledge and understanding of Target Heart Rate Range (THRR) Yes       Intervention Provide education and explanation of THRR including how the numbers were predicted and where they are located for reference       Expected Outcomes Short Term: Able to state/look up THRR;Short Term: Able to use daily as guideline for intensity in rehab;Long Term: Able to use THRR to govern intensity when exercising independently       Able to check pulse independently Yes       Intervention Provide education and demonstration on how to check pulse in carotid and radial  arteries.;Review the importance of being able to check your own pulse for safety during independent exercise       Expected Outcomes Short Term: Able to explain why pulse checking is important during independent exercise;Long Term: Able to check pulse independently and accurately       Understanding of Exercise Prescription Yes       Intervention Provide education, explanation, and written materials on patient's individual exercise prescription       Expected Outcomes Short Term: Able to explain program exercise prescription;Long Term: Able to explain home exercise  prescription to exercise independently              Exercise Goals Re-Evaluation :  Exercise Goals Re-Evaluation    Row Name 11/05/19 1056 11/13/19 1458 11/25/19 1732         Exercise Goal Re-Evaluation   Exercise Goals Review Able to understand and use rate of perceived exertion (RPE) scale;Able to understand and use Dyspnea scale;Understanding of Exercise Prescription;Knowledge and understanding of Target Heart Rate Range (THRR) Increase Physical Activity;Increase Strength and Stamina;Understanding of Exercise Prescription Increase Physical Activity;Increase Strength and Stamina;Understanding of Exercise Prescription     Comments Reviewed RPE and dyspnea scales, THR and program prescription with pt today.  Pt voiced understanding and was given a copy of goals to take home. Gavyn is off to a good start in rehab.  He is already at 2.1 METs on the arm crank.  We will continue to monitor his progress. Jesten attends consistentl and works at [employer] 11[employer].  He works at lower end of THR range.  Staff will encourage increasing levels since he is at RPE 11.     Expected Outcomes Short: Use RPE daily to regulate intensity. Long: Follow program prescription in THR. Short: Continue to attend regularly Long: Continue to follow program prescription Short: increase levels on machines Long:  improve overall stamina            Discharge Exercise  Prescription (Final Exercise Prescription Changes):  Exercise Prescription Changes - 11/25/19 1700      Response to Exercise   Blood Pressure (Admit) 132/58    Blood Pressure (Exercise) 130/58    Blood Pressure (Exit) 124/64    Heart Rate (Admit) 74 bpm    Heart Rate (Exercise) 84 bpm    Heart Rate (Exit) 70 bpm    Rating of Perceived Exertion (Exercise) 11    Symptoms none    Duration Progress to 30 minutes of  aerobic without signs/symptoms of physical distress    Intensity THRR unchanged      Progression   Progression Continue to progress workloads to maintain intensity without signs/symptoms of physical distress.    Average METs 2.53      Resistance Training   Training Prescription Yes    Weight 3 lb    Reps 10-15      Interval Training   Interval Training No      Recumbant Bike   Level 1    Minutes 15    METs 2.96      Arm Ergometer   Level 1    Minutes 15    METs 2.1           Nutrition:  Target Goals: Understanding of nutrition guidelines, daily intake of sodium 1500mg , cholesterol 200mg , calories 30% from fat and 7% or less from saturated fats, daily to have 5 or more servings of fruits and vegetables.  Education: All About Nutrition: -Group instruction provided by verbal, written material, interactive activities, discussions, models, and posters to present general guidelines for heart healthy nutrition including fat, fiber, MyPlate, the role of sodium in heart healthy nutrition, utilization of the nutrition label, and utilization of this knowledge for meal planning. Follow up email sent as well. Written material given at graduation.   Cardiac Rehab from 11/21/2019 in [facility] Cardiac and Pulmonary Rehab  Education need identified 10/28/19      Biometrics:  Pre Biometrics - 10/28/19 1349      Pre Biometrics   Height 5' 9.6" (1.768 m)    Weight  177 lb 8 oz (80.5 kg)    BMI (Calculated) 25.76    Single Leg Stand 1.2 seconds            Nutrition  Therapy Plan and Nutrition Goals:   Nutrition Assessments:  Nutrition Assessments - 10/28/19 1352      MEDFICTS Scores   Pre Score 66          MEDIFICTS Score Key:  ?70 Need to make dietary changes   40-70 Heart Healthy Diet  ? 40 Therapeutic Level Cholesterol Diet   Picture Your Plate Scores:  <65 Unhealthy dietary pattern with much room for improvement.  41-50 Dietary pattern unlikely to meet recommendations for good health and room for improvement.  51-60 More healthful dietary pattern, with some room for improvement.   >60 Healthy dietary pattern, although there may be some specific behaviors that could be improved.    Nutrition Goals Re-Evaluation:  Nutrition Goals Re-Evaluation    Row Name 11/19/19 1003             Goals   Current Weight 171 lb (77.6 kg)       Nutrition Goal Continue to eat Heart Healthy       Comment Patient states that he has no questions on diet and feels that he does not need to meet with the dietician. Informed patient that if he is welcome to meet with the dietician if he changes his mind.       Expected Outcome Short: maintain current diet and weight. Long: maintain a heart healthy diet              Nutrition Goals Discharge (Final Nutrition Goals Re-Evaluation):  Nutrition Goals Re-Evaluation - 11/19/19 1003      Goals   Current Weight 171 lb (77.6 kg)    Nutrition Goal Continue to eat Heart Healthy    Comment Patient states that he has no questions on diet and feels that he does not need to meet with the dietician. Informed patient that if he is welcome to meet with the dietician if he changes his mind.    Expected Outcome Short: maintain current diet and weight. Long: maintain a heart healthy diet           Psychosocial: Target Goals: Acknowledge presence or absence of significant depression and/or stress, maximize coping skills, provide positive support system. Participant is able to verbalize types and ability to use  techniques and skills needed for reducing stress and depression.   Education: Stress, Anxiety, and Depression - Group verbal and visual presentation to define topics covered.  Reviews how body is impacted by stress, anxiety, and depression.  Also discusses healthy ways to reduce stress and to treat/manage anxiety and depression.  Written material given at graduation.   Cardiac Rehab from 11/21/2019 in [facility] Cardiac and Pulmonary Rehab  Education need identified 10/28/19      Education: Sleep Hygiene -Provides group verbal and written instruction about how sleep can affect your health.  Define sleep hygiene, discuss sleep cycles and impact of sleep habits. Review good sleep hygiene tips.    Initial Review & Psychosocial Screening:  Initial Psych Review & Screening - 10/24/19 1039      Initial Review   Current issues with None Identified      Family Dynamics   Good Support System? Yes    Comments He can look to his wife, son and daughter for support. He has a positve outlook on his health.  Barriers   Psychosocial barriers to participate in program The patient should benefit from training in stress management and relaxation.;There are no identifiable barriers or psychosocial needs.      Screening Interventions   Interventions To provide support and resources with identified psychosocial needs;Provide feedback about the scores to participant;Encouraged to exercise    Expected Outcomes Short Term goal: Utilizing psychosocial counselor, staff and physician to assist with identification of specific Stressors or current issues interfering with healing process. Setting desired goal for each stressor or current issue identified.;Long Term Goal: Stressors or current issues are controlled or eliminated.;Short Term goal: Identification and review with participant of any Quality of Life or Depression concerns found by scoring the questionnaire.;Long Term goal: The participant improves quality of  Life and PHQ9 Scores as seen by post scores and/or verbalization of changes           Quality of Life Scores:   Quality of Life - 10/28/19 1349      Quality of Life   Select Quality of Life      Quality of Life Scores   Health/Function Pre 24.7 %    Socioeconomic Pre 27.21 %    Psych/Spiritual Pre 29.14 %    Family Pre 28.8 %    GLOBAL Pre 26.74 %          Scores of 19 and below usually indicate a poorer quality of life in these areas.  A difference of  2-3 points is a clinically meaningful difference.  A difference of 2-3 points in the total score of the Quality of Life Index has been associated with significant improvement in overall quality of life, self-image, physical symptoms, and general health in studies assessing change in quality of life.  PHQ-9: Recent Review Flowsheet Data    Depression screen [facility] 2/9 10/28/2019   Decreased Interest 1   Down, Depressed, Hopeless 0   PHQ - 2 Score 1   Altered sleeping 0   Tired, decreased energy 2   Change in appetite 1   Feeling bad or failure about yourself  0   Trouble concentrating 0   Moving slowly or fidgety/restless 0   Suicidal thoughts 0   PHQ-9 Score 4   Difficult doing work/chores Not difficult at all     Interpretation of Total Score  Total Score Depression Severity:  1-4 = Minimal depression, 5-9 = Mild depression, 10-14 = Moderate depression, 15-19 = Moderately severe depression, 20-27 = Severe depression   Psychosocial Evaluation and Intervention:  Psychosocial Evaluation - 10/24/19 1040      Psychosocial Evaluation & Interventions   Interventions Encouraged to exercise with the program and follow exercise prescription;Relaxation education;Stress management education    Comments He can look to his wife, son and daughter for support. He has a positve outlook on his health.    Expected Outcomes Short: Exercise regularly to support mental health and notify staff of any changes. Long: maintain mental health and  well being through teaching of rehab or prescribed medications independently.    Continue Psychosocial Services  Follow up required by staff           Psychosocial Re-Evaluation:  Psychosocial Re-Evaluation    Row Name 11/19/19 1002             Psychosocial Re-Evaluation   Current issues with None Identified       Comments Patient reports no issues with their current mental states, sleep, stress, depression or anxiety. Will follow up with patient in  a few weeks for any changes.       Expected Outcomes Short: Continue to exercise regularly to support mental health and notify staff of any changes. Long: maintain mental health and well being through teaching of rehab or prescribed medications independently.       Interventions Encouraged to attend Cardiac Rehabilitation for the exercise       Continue Psychosocial Services  Follow up required by staff              Psychosocial Discharge (Final Psychosocial Re-Evaluation):  Psychosocial Re-Evaluation - 11/19/19 1002      Psychosocial Re-Evaluation   Current issues with None Identified    Comments Patient reports no issues with their current mental states, sleep, stress, depression or anxiety. Will follow up with patient in a few weeks for any changes.    Expected Outcomes Short: Continue to exercise regularly to support mental health and notify staff of any changes. Long: maintain mental health and well being through teaching of rehab or prescribed medications independently.    Interventions Encouraged to attend Cardiac Rehabilitation for the exercise    Continue Psychosocial Services  Follow up required by staff           Vocational Rehabilitation: Provide vocational rehab assistance to qualifying candidates.   Vocational Rehab Evaluation & Intervention:   Education: Education Goals: Education classes will be provided on a variety of topics geared toward better understanding of heart health and risk factor modification.  Participant will state understanding/return demonstration of topics presented as noted by education test scores.  Learning Barriers/Preferences:  Learning Barriers/Preferences - 10/24/19 1039      Learning Barriers/Preferences   Learning Barriers None    Learning Preferences None           General Cardiac Education Topics:  AED/CPR: - Group verbal and written instruction with the use of models to demonstrate the basic use of the AED with the basic ABC's of resuscitation.   Anatomy and Cardiac Procedures: - Group verbal and visual presentation and models provide information about basic cardiac anatomy and function. Reviews the testing methods done to diagnose heart disease and the outcomes of the test results. Describes the treatment choices: Medical Management, Angioplasty, or Coronary Bypass Surgery for treating various heart conditions including Myocardial Infarction, Angina, Valve Disease, and Cardiac Arrhythmias.  Written material given at graduation.   Cardiac Rehab from 11/21/2019 in [facility] Cardiac and Pul[facility] Rehab  Education need identified 10/28/19      Medication Safety: - Group verbal and visual instruction to review commonly prescribed medications for heart and lung disease. Reviews the medication, class of the drug, and side effects. Includes the steps to properly store meds and maintain the prescription regimen.  Written material given at graduation.   Intimacy: - Group verbal instruction through game format to discuss how heart and lung disease can affect sexual intimacy. Written material given at graduation..   Know Your Numbers and Heart Failure: - Group verbal and visual instruction to discuss disease risk factors for cardiac and pulmonary disease and treatment options.  Reviews associated critical values for Overweight/Obesity, Hypertension, Cholesterol, and Diabetes.  Discusses basics of heart failure: signs/symptoms and treatments.  Introduces Heart Failure  Zone chart for action plan for heart failure.  Written material given at graduation.   Infection Prevention: - Provides verbal and written material to individual with discussion of infection control including proper hand washing and proper equipment cleaning during exercise session.   Cardiac Rehab from 11/21/2019 in  [facility] Cardiac and Pulmonary Rehab  Date 10/24/19  Educator [facility]  Instruction Review Code 1- Verbalizes Understanding      Falls Prevention: - Provides verbal and written material to individual with discussion of falls prevention and safety.   Cardiac Rehab from 11/21/2019 in [facility] Cardiac and Pulmonary Rehab  Date 10/24/19  Educator [facility]  Instruction Review Code 1- Verbalizes Understanding      Other: -Provides group and verbal instruction on various topics (see comments)   Knowledge Questionnaire Score:  Knowledge Questionnaire Score - 10/28/19 1352      Knowledge Questionnaire Score   Pre Score 21/26 Education Focus: Angina, Depression, Heart Failure, Nutrition, Exercise           Core Components/Risk Factors/Patient Goals at Admission:  Personal Goals and Risk Factors at Admission - 10/28/19 1352      Core Components/Risk Factors/Patient Goals on Admission    Weight Management Yes;Weight Loss    Intervention Weight Management: Develop a combined nutrition and exercise program designed to reach desired caloric intake, while maintaining appropriate intake of nutrient and fiber, sodium and fats, and appropriate energy expenditure required for the weight goal.;Weight Management: Provide education and appropriate resources to help participant work on and attain dietary goals.;Weight Management/Obesity: Establish reasonable short term and long term weight goals.    Admit Weight 177 lb 8 oz (80.5 kg)    Goal Weight: Short Term 172 lb (78 kg)    Goal Weight: Long Term 170 lb (77.1 kg)    Expected Outcomes Short Term: Continue to assess and modify interventions until  short term weight is achieved;Long Term: Adherence to nutrition and physical activity/exercise program aimed toward attainment of established weight goal;Understanding recommendations for meals to include 15-35% energy as protein, 25-35% energy from fat, 35-60% energy from carbohydrates, less than 200mg  of dietary cholesterol, 20-35 gm of total fiber daily;Understanding of distribution of calorie intake throughout the day with the consumption of 4-5 meals/snacks;Weight Loss: Understanding of general recommendations for a balanced deficit meal plan, which promotes 1-2 lb weight loss per week and includes a negative energy balance of [phone] kcal/d    Hypertension Yes    Intervention Provide education on lifestyle modifcations including regular physical activity/exercise, weight management, moderate sodium restriction and increased consumption of fresh fruit, vegetables, and low fat dairy, alcohol moderation, and smoking cessation.;Monitor prescription use compliance.    Expected Outcomes Short Term: Continued assessment and intervention until BP is < 140/53mm HG in hypertensive participants. < 130/11mm HG in hypertensive participants with diabetes, heart failure or chronic kidney disease.;Long Term: Maintenance of blood pressure at goal levels.    Lipids Yes    Intervention Provide education and support for participant on nutrition & aerobic/resistive exercise along with prescribed medications to achieve LDL 70mg , HDL >40mg .    Expected Outcomes Short Term: Participant states understanding of desired cholesterol values and is compliant with medications prescribed. Participant is following exercise prescription and nutrition guidelines.;Long Term: Cholesterol controlled with medications as prescribed, with individualized exercise RX and with personalized nutrition plan. Value goals: LDL < 70mg , HDL > 40 mg.           Education:Diabetes - Individual verbal and written instruction to review signs/symptoms  of diabetes, desired ranges of glucose level fasting, after meals and with exercise. Acknowledge that pre and post exercise glucose checks will be done for 3 sessions at entry of program.   Core Components/Risk Factors/Patient Goals Review:   Goals and Risk Factor Review    Row Name  11/19/19 0959             Core Components/Risk Factors/Patient Goals Review   Personal Goals Review Weight Management/Obesity;Hypertension       Review Ron checks his blood pressure at home every few days. He knows how to take his blood pressure and has no questions on it. He wants to maintain his weight currently.       Expected Outcomes Short: maintain weight and blood pressure readings. Long: have lower blood pressure readings post [facility]              Core Components/Risk Factors/Patient Goals at Discharge (Final Review):   Goals and Risk Factor Review - 11/19/19 0959      Core Components/Risk Factors/Patient Goals Review   Personal Goals Review Weight Management/Obesity;Hypertension    Review Ron checks his blood pressure at home every few days. He knows how to take his blood pressure and has no questions on it. He wants to maintain his weight currently.    Expected Outcomes Short: maintain weight and blood pressure readings. Long: have lower blood pressure readings post HeartTrack           ITP Comments:  ITP Comments    Row Name 10/24/19 1038 10/28/19 1158 11/05/19 1056 11/06/19 0646 12/04/19 0952   ITP Comments Virtual Visit completed. Patient informed on EP and RD appointment and 6 Minute walk test. Patient also informed of patient health questionnaires on My Chart. Patient Verbalizes understanding. Visit diagnosis can be found in [facility] 10/16/2019. Completed and gym orientation. Initial ITP created and sent for review to Dr. Bethann Punches, Medical Director. First full day of exercise!  Patient was oriented to gym and equipment including functions, settings, policies, and procedures.   Patient's individual exercise prescription and treatment plan were reviewed.  All starting workloads were established based on the results of the 6 minute walk test done at initial orientation visit.  The plan for exercise progression was also introduced and progression will be customized based on patient's performance and goals. 30 Day review completed. Medical Director ITP review done, changes made as directed, and signed approval by Medical Director. 30 Day review completed. Medical Director ITP review done, changes made as directed, and signed approval by Medical Director.          Comments:

## 2019-12-05 ENCOUNTER — Other Ambulatory Visit: Payer: Self-pay

## 2019-12-05 ENCOUNTER — Encounter: Payer: Medicare Other | Attending: Cardiology

## 2019-12-05 DIAGNOSIS — I214 Non-ST elevation (NSTEMI) myocardial infarction: Secondary | ICD-10-CM | POA: Insufficient documentation

## 2019-12-05 DIAGNOSIS — Z955 Presence of coronary angioplasty implant and graft: Secondary | ICD-10-CM | POA: Insufficient documentation

## 2019-12-05 NOTE — Progress Notes (Signed)
Daily Session Note  Patient Details  Name: Sergio Grimes MRN: [id number] Date of Birth: 1928-04-30 Referring Provider:     Cardiac Rehab from 10/28/2019 in [facility] Cardiac and Pulmonary Rehab  Referring Provider Isaias Cowman MD      Encounter Date: 12/05/2019  Check In:  Session Check In - 12/05/19 1113      Check-In   Supervising physician immediately available to respond to emergencies See telemetry face sheet for immediately available ER MD    Location [facility]-Cardiac & Pulmonary Rehab    Staff Present Birdie Sons, MPA, RN;Melissa Caiola RDN, Rowe Pavy, BA, ACSM CEP, Exercise Physiologist;Meredith Sherryll Burger, RN BSN    Virtual Visit No    Medication changes reported     No    Fall or balance concerns reported    No    Warm-up and Cool-down Performed on first and last piece of equipment    Resistance Training Performed Yes    VAD Patient? No    PAD/SET Patient? No      Pain Assessment   Currently in Pain? No/denies              Social History   Tobacco Use  Smoking Status Former Smoker  . Packs/day: 1.00  . Years: 20.00  . Pack years: 20.00  . Types: Cigarettes  . Quit date: 01/24/1976  . Years since quitting: 43.8  Smokeless Tobacco Former Systems developer    Goals Met:  Independence with exercise equipment Exercise tolerated well No report of cardiac concerns or symptoms Strength training completed today  Goals Unmet:  Not Applicable  Comments: Pt able to follow exercise prescription today without complaint.  Will continue to monitor for progression.    Dr. Emily Filbert is Medical Director for [place] and [facility].

## 2019-12-10 ENCOUNTER — Encounter: Payer: Medicare Other | Admitting: *Deleted

## 2019-12-10 ENCOUNTER — Other Ambulatory Visit: Payer: Self-pay

## 2019-12-10 DIAGNOSIS — I214 Non-ST elevation (NSTEMI) myocardial infarction: Secondary | ICD-10-CM

## 2019-12-10 DIAGNOSIS — Z955 Presence of coronary angioplasty implant and graft: Secondary | ICD-10-CM

## 2019-12-10 NOTE — Progress Notes (Signed)
Daily Session Note  Patient Details  Name: Sergio Grimes MRN: [id number] Date of Birth: 11/02/1928 Referring Provider:     Cardiac Rehab from 10/28/2019 in [facility] Cardiac and Pulmonary Rehab  Referring Provider Isaias Cowman MD      Encounter Date: 12/10/2019  Check In:  Session Check In - 12/10/19 1023      Check-In   Supervising physician immediately available to respond to emergencies See telemetry face sheet for immediately available ER MD    Location [facility]-Cardiac & Pulmonary Rehab    Staff Present Heath Lark, RN, BSN, Jacklynn Bue, MS Exercise Physiologist;Amanda Oletta Darter, [place], ACSM CEP, Exercise Physiologist    Virtual Visit No    Medication changes reported     No    Fall or balance concerns reported    No    Warm-up and Cool-down Performed on first and last piece of equipment    Resistance Training Performed Yes    VAD Patient? No    PAD/SET Patient? No      Pain Assessment   Currently in Pain? No/denies              Social History   Tobacco Use  Smoking Status Former Smoker  . Packs/day: 1.00  . Years: 20.00  . Pack years: 20.00  . Types: Cigarettes  . Quit date: 01/24/1976  . Years since quitting: 43.9  Smokeless Tobacco Former Systems developer    Goals Met:  Independence with exercise equipment Exercise tolerated well No report of cardiac concerns or symptoms  Goals Unmet:  Not Applicable  Comments: Pt able to follow exercise prescription today without complaint.  Will continue to monitor for progression.    Dr. Emily Filbert is Medical Director for [place] and [facility].

## 2019-12-12 ENCOUNTER — Other Ambulatory Visit: Payer: Self-pay

## 2019-12-12 DIAGNOSIS — Z955 Presence of coronary angioplasty implant and graft: Secondary | ICD-10-CM

## 2019-12-12 DIAGNOSIS — I214 Non-ST elevation (NSTEMI) myocardial infarction: Secondary | ICD-10-CM | POA: Diagnosis not present

## 2019-12-12 NOTE — Progress Notes (Signed)
Daily Session Note  Patient Details  Name: CALVEN GILKES MRN: [id number] Date of Birth: 04-Dec-1928 Referring Provider:   Flowsheet Row Cardiac Rehab from 10/28/2019 in [facility] Cardiac and Pulmonary Rehab  Referring Provider Isaias Cowman MD      Encounter Date: 12/12/2019  Check In:  Session Check In - 12/12/19 0958      Check-In   Supervising physician immediately available to respond to emergencies See telemetry face sheet for immediately available ER MD    Location [facility]-Cardiac & Pulmonary Rehab    Staff Present Birdie Sons, MPA, RN;Melissa Caiola RDN, Rowe Pavy, BA, ACSM CEP, Exercise Physiologist;Jessica [place]a, MA, RCEP, CCRP, CCET    Virtual Visit No    Medication changes reported     No    Fall or balance concerns reported    No    Warm-up and Cool-down Performed on first and last piece of equipment    Resistance Training Performed Yes    VAD Patient? No    PAD/SET Patient? No      Pain Assessment   Currently in Pain? No/denies              Social History   Tobacco Use  Smoking Status Former Smoker  . Packs/day: 1.00  . Years: 20.00  . Pack years: 20.00  . Types: Cigarettes  . Quit date: 01/24/1976  . Years since quitting: 43.9  Smokeless Tobacco Former Systems developer    Goals Met:  Independence with exercise equipment Exercise tolerated well No report of cardiac concerns or symptoms Strength training completed today  Goals Unmet:  Not Applicable  Comments: Pt able to follow exercise prescription today without complaint.  Will continue to monitor for progression.    Dr. Emily Filbert is Medical Director for [place] and [facility].

## 2019-12-17 ENCOUNTER — Encounter: Payer: Medicare Other | Admitting: *Deleted

## 2019-12-17 ENCOUNTER — Other Ambulatory Visit: Payer: Self-pay

## 2019-12-17 DIAGNOSIS — I214 Non-ST elevation (NSTEMI) myocardial infarction: Secondary | ICD-10-CM

## 2019-12-17 NOTE — Progress Notes (Signed)
Daily Session Note  Patient Details  Name: Sergio Grimes MRN: [id number] Date of Birth: 28-Apr-1928 Referring Provider:   Flowsheet Row Cardiac Rehab from 10/28/2019 in [facility] Cardiac and Pulmonary Rehab  Referring Provider Isaias Cowman MD      Encounter Date: 12/17/2019  Check In:  Session Check In - 12/17/19 1039      Check-In   Supervising physician immediately available to respond to emergencies See telemetry face sheet for immediately available ER MD    Location [facility]-Cardiac & Pulmonary Rehab    Staff Present Heath Lark, RN, BSN, Jacklynn Bue, MS Exercise Physiologist;Amanda Oletta Darter, [place], ACSM CEP, Exercise Physiologist    Virtual Visit No    Medication changes reported     No    Fall or balance concerns reported    No    Warm-up and Cool-down Performed on first and last piece of equipment    Resistance Training Performed Yes    VAD Patient? No    PAD/SET Patient? No      Pain Assessment   Currently in Pain? No/denies             Exercise Prescription Changes - 12/17/19 1000      Home Exercise Plan   Plans to continue exercise at Home (comment)   walk   Frequency Add 2 additional days to program exercise sessions.    Initial Home Exercises Provided 12/17/19           Social History   Tobacco Use  Smoking Status Former Smoker  . Packs/day: 1.00  . Years: 20.00  . Pack years: 20.00  . Types: Cigarettes  . Quit date: 01/24/1976  . Years since quitting: 43.9  Smokeless Tobacco Former Systems developer    Goals Met:  Independence with exercise equipment Exercise tolerated well No report of cardiac concerns or symptoms  Goals Unmet:  Not Applicable  Comments: Pt able to follow exercise prescription today without complaint.  Will continue to monitor for progression.    Dr. Emily Filbert is Medical Director for [place] and [facility].

## 2019-12-17 NOTE — Progress Notes (Signed)
Daily Session Note  Patient Details  Name: Sergio Grimes MRN: [id number] Date of Birth: Aug 06, 1928 Referring Provider:   Flowsheet Row Cardiac Rehab from 10/28/2019 in [facility] Cardiac and Pulmonary Rehab  Referring Provider Isaias Cowman MD      Encounter Date: 12/17/2019  Check In:      Social History   Tobacco Use  Smoking Status Former Smoker  . Packs/day: 1.00  . Years: 20.00  . Pack years: 20.00  . Types: Cigarettes  . Quit date: 01/24/1976  . Years since quitting: 43.9  Smokeless Tobacco Former Systems developer    Goals Met:  Independence with exercise equipment Exercise tolerated well No report of cardiac concerns or symptoms Strength training completed today  Goals Unmet:  Not Applicable  Comments: Reviewed home exercise with pt today.  Pt plans to walk for exercise.  Reviewed THR, pulse, RPE, sign and symptoms, pulse oximetery and when to call 911 or MD.  Also discussed weather considerations and indoor options.  Pt voiced understanding.    Dr. Emily Filbert is Medical Director for [place] and [facility].

## 2019-12-24 ENCOUNTER — Other Ambulatory Visit: Payer: Self-pay

## 2019-12-24 ENCOUNTER — Encounter: Payer: Medicare Other | Admitting: *Deleted

## 2019-12-24 DIAGNOSIS — I214 Non-ST elevation (NSTEMI) myocardial infarction: Secondary | ICD-10-CM

## 2019-12-24 NOTE — Progress Notes (Signed)
Daily Session Note  Patient Details  Name: Sergio Grimes MRN: [id number] Date of Birth: 1928-08-30 Referring Provider:   Flowsheet Row Cardiac Rehab from 10/28/2019 in [facility] Cardiac and Pulmonary Rehab  Referring Provider Isaias Cowman MD      Encounter Date: 12/24/2019  Check In:  Session Check In - 12/24/19 1024      Check-In   Supervising physician immediately available to respond to emergencies See telemetry face sheet for immediately available ER MD    Location [facility]-Cardiac & Pulmonary Rehab    Staff Present Heath Lark, RN, BSN, CCRP;Jessica Amory, MA, RCEP, CCRP, Marylynn Pearson, MS Exercise Physiologist    Virtual Visit No    Medication changes reported     No    Fall or balance concerns reported    No    Warm-up and Cool-down Performed on first and last piece of equipment    Resistance Training Performed Yes    VAD Patient? No    PAD/SET Patient? No      Pain Assessment   Currently in Pain? No/denies              Social History   Tobacco Use  Smoking Status Former Smoker  . Packs/day: 1.00  . Years: 20.00  . Pack years: 20.00  . Types: Cigarettes  . Quit date: 01/24/1976  . Years since quitting: 43.9  Smokeless Tobacco Former Systems developer    Goals Met:  Independence with exercise equipment Exercise tolerated well No report of cardiac concerns or symptoms  Goals Unmet:  Not Applicable  Comments: Pt able to follow exercise prescription today without complaint.  Will continue to monitor for progression.    Dr. Emily Filbert is Medical Director for [place] and [facility].

## 2019-12-26 ENCOUNTER — Other Ambulatory Visit: Payer: Self-pay

## 2019-12-26 DIAGNOSIS — I214 Non-ST elevation (NSTEMI) myocardial infarction: Secondary | ICD-10-CM

## 2019-12-26 DIAGNOSIS — Z955 Presence of coronary angioplasty implant and graft: Secondary | ICD-10-CM

## 2019-12-26 NOTE — Progress Notes (Signed)
Daily Session Note  Patient Details  Name: HIKEEM ANDERSSON MRN: [id number] Date of Birth: October 26, 1928 Referring Provider:   Flowsheet Row Cardiac Rehab from 10/28/2019 in [facility] Cardiac and Pulmonary Rehab  Referring Provider Isaias Cowman MD      Encounter Date: 12/26/2019  Check In:  Session Check In - 12/26/19 1001      Check-In   Supervising physician immediately available to respond to emergencies See telemetry face sheet for immediately available ER MD    Location [facility]-Cardiac & Pulmonary Rehab    Staff Present Birdie Sons, MPA, RN;Melissa Caiola RDN, Rowe Pavy, BA, ACSM CEP, Exercise Physiologist    Virtual Visit No    Medication changes reported     No    Fall or balance concerns reported    No    Warm-up and Cool-down Performed on first and last piece of equipment    Resistance Training Performed Yes    VAD Patient? No    PAD/SET Patient? No      Pain Assessment   Currently in Pain? No/denies              Social History   Tobacco Use  Smoking Status Former Smoker  . Packs/day: 1.00  . Years: 20.00  . Pack years: 20.00  . Types: Cigarettes  . Quit date: 01/24/1976  . Years since quitting: 43.9  Smokeless Tobacco Former Systems developer    Goals Met:  Independence with exercise equipment Exercise tolerated well No report of cardiac concerns or symptoms Strength training completed today  Goals Unmet:  Not Applicable  Comments: Pt able to follow exercise prescription today without complaint.  Will continue to monitor for progression.    Dr. Emily Filbert is Medical Director for [place] and [facility].

## 2019-12-31 ENCOUNTER — Encounter: Payer: Medicare Other | Admitting: *Deleted

## 2019-12-31 ENCOUNTER — Other Ambulatory Visit: Payer: Self-pay

## 2019-12-31 DIAGNOSIS — I214 Non-ST elevation (NSTEMI) myocardial infarction: Secondary | ICD-10-CM | POA: Diagnosis not present

## 2019-12-31 NOTE — Progress Notes (Signed)
Daily Session Note  Patient Details  Name: Sergio Grimes MRN: [id number] Date of Birth: [birth year] Referring Provider:   Flowsheet Row Cardiac Rehab from 10/28/2019 in [facility] Cardiac and Pulmonary Rehab  Referring Provider Isaias Cowman MD      Encounter Date: 12/31/2019  Check In:  Session Check In - 12/31/19 1059      Check-In   Supervising physician immediately available to respond to emergencies See telemetry face sheet for immediately available ER MD    Location [facility]-Cardiac & Pulmonary Rehab    Staff Present Hope Budds RDN, Rowe Pavy, BA, ACSM CEP, Exercise Physiologist;[facility];Heath Lark, RN, BSN, CCRP    Virtual Visit No    Medication changes reported     No    Fall or balance concerns reported    No    Warm-up and Cool-down Performed on first and last piece of equipment    Resistance Training Performed Yes    VAD Patient? No    PAD/SET Patient? No      Pain Assessment   Currently in Pain? No/denies              Social History   Tobacco Use  Smoking Status Former Smoker  . Packs/day: 1.00  . Years: 20.00  . Pack years: 20.00  . Types: Cigarettes  . Quit date: 01/24/1976  . Years since quitting: 43.9  Smokeless Tobacco Former Systems developer    Goals Met:  Independence with exercise equipment Exercise tolerated well No report of cardiac concerns or symptoms  Goals Unmet:  Not Applicable  Comments: Pt able to follow exercise prescription today without complaint.  Will continue to monitor for progression.    Dr. Emily Filbert is Medical Director for [place] and [facility].

## 2020-01-01 ENCOUNTER — Encounter: Payer: Self-pay | Admitting: *Deleted

## 2020-01-01 DIAGNOSIS — I214 Non-ST elevation (NSTEMI) myocardial infarction: Secondary | ICD-10-CM

## 2020-01-01 NOTE — Progress Notes (Signed)
Cardiac Individual Treatment Plan  Patient Details  Name: Sergio Grimes MRN: [id number] Date of Birth: 12-Mar-1928 Referring Provider:   Flowsheet Row Cardiac Rehab from 10/28/2019 in [facility] Cardiac and Pulmonary Rehab  Referring Provider Isaias Cowman MD      Initial Encounter Date:  Flowsheet Row Cardiac Rehab from 10/28/2019 in [facility] Cardiac and Pulmonary Rehab  Date 10/28/19      Visit Diagnosis: NSTEMI (non-ST elevated myocardial infarction) [facility])  Patient's Home Medications on Admission:  Current Outpatient Medications:  .  aspirin EC 81 MG tablet, Take 81 mg by mouth at bedtime., Disp: , Rfl:  .  clopidogrel (PLAVIX) 75 MG tablet, Take 75 mg by mouth daily at 12 noon. , Disp: , Rfl:  .  finasteride (PROSCAR) 5 MG tablet, Take 5 mg by mouth every evening. , Disp: , Rfl:  .  fluoruracil (CARAC) 0.5 % cream, Apply 1 application topically daily as needed (rash)., Disp: , Rfl:  .  levothyroxine (SYNTHROID, LEVOTHROID) 88 MCG tablet, Take 88 mcg by mouth daily before breakfast., Disp: , Rfl:  .  lisinopril (PRINIVIL,ZESTRIL) 5 MG tablet, Take 5 mg by mouth daily at 12 noon. , Disp: , Rfl:  .  metoprolol succinate (TOPROL-XL) 25 MG 24 hr tablet, Take 25 mg by mouth daily., Disp: , Rfl:  .  Multiple Vitamin (MULTIVITAMIN WITH MINERALS) TABS tablet, Take 1 tablet by mouth daily at 12 noon., Disp: , Rfl:  .  rosuvastatin (CRESTOR) 40 MG tablet, Take 40 mg by mouth at bedtime. , Disp: , Rfl:  .  vitamin B-12 (CYANOCOBALAMIN) 500 MCG tablet, Take 500 mcg by mouth daily. , Disp: , Rfl:   Past Medical History: Past Medical History:  Diagnosis Date  . BPH (benign prostatic hyperplasia)   . Coronary artery disease   . High cholesterol   . Hypertension   . Hypothyroidism     Tobacco Use: Social History   Tobacco Use  Smoking Status Former Smoker  . Packs/day: 1.00  . Years: 20.00  . Pack years: 20.00  . Types: Cigarettes  . Quit date: 01/24/1976  . 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   Exercise Prescription Goal: Initial exercise prescription builds to 30-45 minutes a day of aerobic activity, 2-3 days per week.  Home exercise guidelines will be given to patient during program as part of exercise prescription that the participant will acknowledge.   Education: Aerobic Exercise: - Group verbal and visual presentation on the components of exercise prescription. Introduces F.I.T.T principle from ACSM for exercise prescriptions.  Reviews F.I.T.T. principles of aerobic exercise including progression. Written material given at graduation.   Education: Resistance Exercise: - Group verbal and visual presentation on the components of exercise prescription. Introduces F.I.T.T principle from ACSM for exercise prescriptions  Reviews F.I.T.T. principles of resistance exercise including progression. Written material given at graduation.    Education: Exercise & Equipment Safety: - Individual verbal instruction and demonstration of equipment use and safety with use of  the equipment. Flowsheet Row Cardiac Rehab from 11/21/2019 in [facility] Cardiac and Pulmonary Rehab  Date 10/24/19  Educator [facility]  Instruction Review Code 1- Verbalizes Understanding      Education: Exercise Physiology & General Exercise Guidelines: - Group verbal and written instruction with models to review the exercise physiology of the cardiovascular system and associated critical values. Provides general exercise guidelines with specific guidelines to those with  heart or lung disease.  Flowsheet Row Cardiac Rehab from 11/21/2019 in [facility] Cardiac and Pulmonary Rehab  Education need identified 10/28/19      Education: Flexibility, Balance, Mind/Body Relaxation: - Group verbal and visual presentation with interactive activity on the components of exercise prescription. Introduces F.I.T.T principle from ACSM for exercise prescriptions. Reviews F.I.T.T. principles of flexibility and balance exercise training including progression. Also discusses the mind body connection.  Reviews various relaxation techniques to help reduce and manage stress (i.e. Deep breathing, progressive muscle relaxation, and visualization). Balance handout provided to take home. Written material given at graduation.   Activity Barriers & Risk Stratification:  Activity Barriers & Cardiac Risk Stratification - 10/28/19 1326      Activity Barriers & Cardiac Risk Stratification   Activity Barriers Muscular Weakness;Deconditioning;Balance Concerns;Shortness of Breath;Other (comment)    Comments walks with limp on R side.  Drags right foot some    Cardiac Risk Stratification Moderate           6 Minute Walk:  6 Minute Walk    Row Name 10/28/19 1323         6 Minute Walk   Phase Initial     Distance 880 feet     Walk Time 6 minutes     # of Rest Breaks 0     MPH 1.67     METS 1.12     RPE 12     Perceived Dyspnea  3     VO2 Peak 3.91     Symptoms Yes (comment)     Comments SOB     Resting HR 59 bpm     Resting BP 106/54     Resting Oxygen Saturation  100 %     Exercise Oxygen Saturation  during 6 min walk 100 %     Max Ex. HR 85 bpm     Max Ex. BP 128/64     2 Minute Post BP 112/64            Oxygen Initial Assessment:   Oxygen Re-Evaluation:   Oxygen Discharge (Final Oxygen Re-Evaluation):   Initial Exercise Prescription:  Initial Exercise Prescription - 10/28/19 1300      Date of Initial Exercise RX and Referring Provider   Date 10/28/19     Referring Provider Paraschos, Alexander MD      Treadmill   MPH 1.5    Grade 0.5    Minutes 15    METs 2.15      NuStep   Level 1    SPM 80    Minutes 15    METs 2      T5 Nustep   Level 1    SPM 80    Minutes 15    METs 2      Biostep-RELP   Level 1    SPM 50    Minutes 15    METs 2      Prescription Details   Frequency (times per week) 2    Duration Progress to 30 minutes of continuous aerobic without signs/symptoms of physical  distress      Intensity   THRR 40-80% of Max Heartrate 88-117    Ratings of Perceived Exertion 11-13    Perceived Dyspnea 0-4      Progression   Progression Continue to progress workloads to maintain intensity without signs/symptoms of physical distress.      Resistance Training   Training Prescription Yes    Weight 3 lb    Reps 10-15           Perform Capillary Blood Glucose checks as needed.  Exercise Prescription Changes:  Exercise Prescription Changes    Row Name 10/28/19 1300 11/13/19 1500 11/25/19 1700 12/11/19 0900 12/17/19 1000     Response to Exercise   Blood Pressure (Admit) 106/54 132/62 132/58 128/64 --   Blood Pressure (Exercise) 128/64 140/60 130/58 152/60 --   Blood Pressure (Exit) 112/64 112/50 124/64 128/64 --   Heart Rate (Admit) 59 bpm 66 bpm 74 bpm 65 bpm --   Heart Rate (Exercise) 85 bpm 79 bpm 84 bpm 79 bpm --   Heart Rate (Exit) 63 bpm 69 bpm 70 bpm 63 bpm --   Oxygen Saturation (Admit) 100 % -- -- -- --   Oxygen Saturation (Exercise) 100 % -- -- -- --   Rating of Perceived Exertion (Exercise) _0 --   Perceived Dyspnea (Exercise) 3 -- -- -- --   Symptoms none none none none --   Comments walk test results -- -- -- --   Duration -- Progress to 30 minutes of  aerobic without signs/symptoms of physical distress Progress to 30 minutes of  aerobic without signs/symptoms of physical distress Continue with 30 min of aerobic exercise without signs/symptoms of physical distress. --   Intensity -- THRR  unchanged THRR unchanged THRR unchanged --     Progression   Progression -- Continue to progress workloads to maintain intensity without signs/symptoms of physical distress. Continue to progress workloads to maintain intensity without signs/symptoms of physical distress. Continue to progress workloads to maintain intensity without signs/symptoms of physical distress. --   Average METs -- 2.35 2.53 2.41 --     Resistance Training   Training Prescription -- Yes Yes Yes --   Weight -- 3 lb 3 lb 3 lb --   Reps -- 10-15 10-15 10-15 --     Interval Training   Interval Training -- No No No --     Recumbant Bike   Level -- _1 --   Watts -- 15 -- 18 --   Minutes -- _2 --   METs -- 2.59 2.96 2.4 --     NuStep   Level -- 1 -- -- --   Minutes -- 15 -- -- --   METs -- 2.7 -- -- --     Arm Ergometer   Level -- _3 --   Minutes -- _4 --   METs -- 2.1 2.1 2 --     Biostep-RELP   Level -- 1 -- -- --   Minutes -- 15 -- -- --   METs -- 2 -- -- --     Home Exercise Plan   Plans to continue exercise at -- -- -- -- Home (comment)  walk   Frequency -- -- -- -- Add 2 additional days to program exercise sessions.   Initial Home Exercises Provided -- -- -- -- 12/17/19   Row Name 12/24/19 0900  Response to Exercise   Blood Pressure (Admit) 132/64       Blood Pressure (Exercise) 148/58       Blood Pressure (Exit) 126/58       Heart Rate (Admit) 70 bpm       Heart Rate (Exercise) 93 bpm       Heart Rate (Exit) 67 bpm       Rating of Perceived Exertion (Exercise) 13       Symptoms none       Duration Continue with 30 min of aerobic exercise without signs/symptoms of physical distress.       Intensity THRR unchanged               Progression   Progression Continue to progress workloads to maintain intensity without signs/symptoms of physical distress.       Average METs 2.1               Resistance Training   Training Prescription Yes       Weight 3 lb        Reps 10-15               Interval Training   Interval Training No               Treadmill   MPH 1.5       Grade 0.5       Minutes 15       METs 2.25               REL-XR   Level 1       Watts 50       Minutes 15       METs 2              Exercise Comments:  Exercise Comments    Row Name 11/05/19 1056           Exercise Comments First full day of exercise!  Patient was oriented to gym and equipment including functions, settings, policies, and procedures.  Patient's individual exercise prescription and treatment plan were reviewed.  All starting workloads were established based on the results of the 6 minute walk test done at initial orientation visit.  The plan for exercise progression was also introduced and progression will be customized based on patient's performance and goals.              Exercise Goals and Review:  Exercise Goals    Row Name 10/28/19 1348             Exercise Goals   Increase Physical Activity Yes       Intervention Provide advice, education, support and counseling about physical activity/exercise needs.;Develop an individualized exercise prescription for aerobic and resistive training based on initial evaluation findings, risk stratification, comorbidities and participant's personal goals.       Expected Outcomes Short Term: Attend rehab on a regular basis to increase amount of physical activity.;Long Term: Add in home exercise to make exercise part of routine and to increase amount of physical activity.;Long Term: Exercising regularly at least 3-5 days a week.       Increase Strength and Stamina Yes       Intervention Provide advice, education, support and counseling about physical activity/exercise needs.;Develop an individualized exercise prescription for aerobic and resistive training based on initial evaluation findings, risk stratification, comorbidities and participant's personal goals.       Expected Outcomes Short Term: Increase  workloads from initial  exercise prescription for resistance, speed, and METs.;Short Term: Perform resistance training exercises routinely during rehab and add in resistance training at home;Long Term: Improve cardiorespiratory fitness, muscular endurance and strength as measured by increased METs and functional capacity (6MWT)       Able to understand and use rate of perceived exertion (RPE) scale Yes       Intervention Provide education and explanation on how to use RPE scale       Expected Outcomes Long Term:  Able to use RPE to guide intensity level when exercising independently;Short Term: Able to use RPE daily in rehab to express subjective intensity level       Able to understand and use Dyspnea scale Yes       Intervention Provide education and explanation on how to use Dyspnea scale       Expected Outcomes Short Term: Able to use Dyspnea scale daily in rehab to express subjective sense of shortness of breath during exertion;Long Term: Able to use Dyspnea scale to guide intensity level when exercising independently       Knowledge and understanding of Target Heart Rate Range (THRR) Yes       Intervention Provide education and explanation of THRR including how the numbers were predicted and where they are located for reference       Expected Outcomes Short Term: Able to state/look up THRR;Short Term: Able to use daily as guideline for intensity in rehab;Long Term: Able to use THRR to govern intensity when exercising independently       Able to check pulse independently Yes       Intervention Provide education and demonstration on how to check pulse in carotid and radial arteries.;Review the importance of being able to check your own pulse for safety during independent exercise       Expected Outcomes Short Term: Able to explain why pulse checking is important during independent exercise;Long Term: Able to check pulse independently and accurately       Understanding of Exercise Prescription Yes        Intervention Provide education, explanation, and written materials on patient's individual exercise prescription       Expected Outcomes Short Term: Able to explain program exercise prescription;Long Term: Able to explain home exercise prescription to exercise independently              Exercise Goals Re-Evaluation :  Exercise Goals Re-Evaluation    Row Name 11/05/19 1056 11/13/19 1458 11/25/19 1732 12/11/19 0944 12/17/19 0956     Exercise Goal Re-Evaluation   Exercise Goals Review Able to understand and use rate of perceived exertion (RPE) scale;Able to understand and use Dyspnea scale;Understanding of Exercise Prescription;Knowledge and understanding of Target Heart Rate Range (THRR) Increase Physical Activity;Increase Strength and Stamina;Understanding of Exercise Prescription Increase Physical Activity;Increase Strength and Stamina;Understanding of Exercise Prescription Increase Physical Activity;Increase Strength and Stamina;Understanding of Exercise Prescription Increase Physical Activity;Increase Strength and Stamina;Understanding of Exercise Prescription   Comments Reviewed RPE and dyspnea scales, THR and program prescription with pt today.  Pt voiced understanding and was given a copy of goals to take home. Sergio Grimes is off to a good start in rehab.  He is already at 2.1 METs on the arm crank.  We will continue to monitor his progress. Sergio Grimes attends consistentl and works at [employer].  He works at lower end of THR range.  Staff will encourage increasing levels since he is at RPE 11. Sergio Grimes is doing well in rehab.  He is  up to 18 watts on the bike.  We will continue to monitor his progress. He reports trying to do some exercise at home. 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 He feels that rehab is helping. EPs have not gone over home exercise yet with Sergio Grimes.   Expected Outcomes Short: Use RPE daily to regulate intensity. Long: Follow program prescription in THR. Short: Continue to  attend regularly Long: Continue to follow program prescription Short: increase levels on machines Long:  improve overall stamina Short: Continue to increase workloads Long; Continue to improve stamina. ST: continue to attend rehab regularly, EP to go over home exercise. LT: Exercise 150 minutes of moderate exercise per week, 2 days of weighted exercise per week.   Sergio Grimes Name 12/17/19 1028 12/24/19 0947           Exercise Goal Re-Evaluation   Comments Reviewed home exercise with pt today.  Pt plans to walk for exercise.  Reviewed THR, pulse, RPE, sign and symptoms, pulse oximetery and when to call 911 or MD.  Also discussed weather considerations and indoor options.  Pt voiced understanding. Sergio Grimes reports no symptoms with exercise.  Staff will encourage increasing levels on all machines and to 4 lb for strength work.      Expected Outcomes -- Short: increase levels on machines one at a time Long: improve overall MET level             Discharge Exercise Prescription (Final Exercise Prescription Changes):  Exercise Prescription Changes - 12/24/19 0900      Response to Exercise   Blood Pressure (Admit) 132/64    Blood Pressure (Exercise) 148/58    Blood Pressure (Exit) 126/58    Heart Rate (Admit) 70 bpm    Heart Rate (Exercise) 93 bpm    Heart Rate (Exit) 67 bpm    Rating of Perceived Exertion (Exercise) 13    Symptoms none    Duration Continue with 30 min of aerobic exercise without signs/symptoms of physical distress.    Intensity THRR unchanged      Progression   Progression Continue to progress workloads to maintain intensity without signs/symptoms of physical distress.    Average METs 2.1      Resistance Training   Training Prescription Yes    Weight 3 lb    Reps 10-15      Interval Training   Interval Training No      Treadmill   MPH 1.5    Grade 0.5    Minutes 15    METs 2.25      REL-XR   Level 1    Watts 50    Minutes 15    METs 2           Nutrition:   Target Goals: Understanding of nutrition guidelines, daily intake of sodium <1533m, cholesterol <2081m calories 30% from fat and 7% or less from saturated fats, daily to have 5 or more servings of fruits and vegetables.  Education: All About Nutrition: -Group instruction provided by verbal, written material, interactive activities, discussions, models, and posters to present general guidelines for heart healthy nutrition including fat, fiber, MyPlate, the role of sodium in heart healthy nutrition, utilization of the nutrition label, and utilization of this knowledge for meal planning. Follow up email sent as well. Written material given at graduation. [facility]  Education need identified 10/28/19      Biometrics:  Pre Biometrics -  10/28/19 1349      Pre Biometrics   Height 5' 9.6" (1.768 m)    Weight 177 lb 8 oz (80.5 kg)    BMI (Calculated) 25.76    Single Leg Stand 1.2 seconds            Nutrition Therapy Plan and Nutrition Goals:  Nutrition Therapy & Goals - 12/17/19 1004      Personal Nutrition Goals   Comments Sergio Grimes reports he would like to continue with nutritoin on his own and would not like ot meet with RD. will continue to check in.           Nutrition Assessments:  Nutrition Assessments - 10/28/19 1352      MEDFICTS Scores   Pre Score 66          MEDIFICTS Score Key:  ?70 Need to make dietary changes   40-70 Heart Healthy Diet  ? 40 Therapeutic Level Cholesterol Diet   Picture Your Plate Scores:  <49 Unhealthy dietary pattern with much room for improvement.  41-50 Dietary pattern unlikely to meet recommendations for good health and room for improvement.  51-60 More healthful dietary pattern, with some room for improvement.   >60 Healthy dietary pattern, although there may be some specific behaviors that could be improved.    Nutrition Goals Re-Evaluation:  Nutrition  Goals Re-Evaluation    [place] Name 11/19/19 1003 12/17/19 1005           Goals   Current Weight 171 lb (77.6 kg) --      Nutrition Goal Continue to eat Heart Healthy No goals at this time - does not want to meet with RD at this time.      Comment Patient states that he has no questions on diet and feels that he does not need to meet with the dietician. Informed patient that if he is welcome to meet with the dietician if he changes his mind. Tomie reports he would like to continue with nutritoin on his own and would not like ot meet with RD. will continue to check in.      Expected Outcome Short: maintain current diet and weight. Long: maintain a heart healthy diet No goals at this time - does not want to meet with RD at this time.             Nutrition Goals Discharge (Final Nutrition Goals Re-Evaluation):  Nutrition Goals Re-Evaluation - 12/17/19 1005      Goals   Nutrition Goal No goals at this time - does not want to meet with RD at this time.    Comment Sergio Grimes reports he would like to continue with nutritoin on his own and would not like ot meet with RD. will continue to check in.    Expected Outcome No goals at this time - does not want to meet with RD at this time.           Psychosocial: Target Goals: Acknowledge presence or absence of significant depression and/or stress, maximize coping skills, provide positive support system. Participant is able to verbalize types and ability to use techniques and skills needed for reducing stress and depression.   Education: Stress, Anxiety, and Depression - Group verbal and visual presentation to define topics covered.  Reviews how body is impacted by stress, anxiety, and depression.  Also discusses healthy ways to reduce stress and to treat/manage anxiety and depression.  Written material given at graduation. Flowsheet Row Cardiac Rehab from 11/21/2019 in [facility]  Cardiac and Pulmonary Rehab  Education need identified 10/28/19       Education: Sleep Hygiene -Provides group verbal and written instruction about how sleep can affect your health.  Define sleep hygiene, discuss sleep cycles and impact of sleep habits. Review good sleep hygiene tips.    Initial Review & Psychosocial Screening:  Initial Psych Review & Screening - 10/24/19 1039      Initial Review   Current issues with None Identified      Family Dynamics   Good Support System? Yes    Comments He can look to his wife, son and daughter for support. He has a positve outlook on his health.      Barriers   Psychosocial barriers to participate in program The patient should benefit from training in stress management and relaxation.;There are no identifiable barriers or psychosocial needs.      Screening Interventions   Interventions To provide support and resources with identified psychosocial needs;Provide feedback about the scores to participant;Encouraged to exercise    Expected Outcomes Short Term goal: Utilizing psychosocial counselor, staff and physician to assist with identification of specific Stressors or current issues interfering with healing process. Setting desired goal for each stressor or current issue identified.;Long Term Goal: Stressors or current issues are controlled or eliminated.;Short Term goal: Identification and review with participant of any Quality of Life or Depression concerns found by scoring the questionnaire.;Long Term goal: The participant improves quality of Life and PHQ9 Scores as seen by post scores and/or verbalization of changes           Quality of Life Scores:   Quality of Life - 10/28/19 1349      Quality of Life   Select Quality of Life      Quality of Life Scores   Health/Function Pre 24.7 %    Socioeconomic Pre 27.21 %    Psych/Spiritual Pre 29.14 %    Family Pre 28.8 %    GLOBAL Pre 26.74 %          Scores of 19 and below usually indicate a poorer quality of life in these areas.  A difference of  2-3  points is a clinically meaningful difference.  A difference of 2-3 points in the total score of the Quality of Life Index has been associated with significant improvement in overall quality of life, self-image, physical symptoms, and general health in studies assessing change in quality of life.  PHQ-9: Recent Review Flowsheet Data    Depression screen [facility] 2/9 10/28/2019   Decreased Interest 1   Down, Depressed, Hopeless 0   PHQ - 2 Score 1   Altered sleeping 0   Tired, decreased energy 2   Change in appetite 1   Feeling bad or failure about yourself  0   Trouble concentrating 0   Moving slowly or fidgety/restless 0   Suicidal thoughts 0   PHQ-9 Score 4   Difficult doing work/chores Not difficult at all     Interpretation of Total Score  Total Score Depression Severity:  1-4 = Minimal depression, 5-9 = Mild depression, 10-14 = Moderate depression, 15-19 = Moderately severe depression, 20-27 = Severe depression   Psychosocial Evaluation and Intervention:  Psychosocial Evaluation - 10/24/19 1040      Psychosocial Evaluation & Interventions   Interventions Encouraged to exercise with the program and follow exercise prescription;Relaxation education;Stress management education    Comments He can look to his wife, son and daughter for support. He has a positve  outlook on his health.    Expected Outcomes Short: Exercise regularly to support mental health and notify staff of any changes. Long: maintain mental health and well being through teaching of rehab or prescribed medications independently.    Continue Psychosocial Services  Follow up required by staff           Psychosocial Re-Evaluation:  Psychosocial Re-Evaluation    [place] Name 11/19/19 1002 12/17/19 1002           Psychosocial Re-Evaluation   Current issues with None Identified --      Comments Patient reports no issues with their current mental states, sleep, stress, depression or anxiety. Will follow up with patient in  a few weeks for any changes. Patient reports that he continues to have no issues with current mental states, sleep, stress, depression or anxiety. Will continue to follow up with patient in a few weeks for any changes. He reports having his friends as a good support system for himself. He repors sitting and doing some deep brething exercises/meditation when he has stress. 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      Expected Outcomes Short: Continue to exercise regularly to support mental health and notify staff of any changes. Long: maintain mental health and well being through teaching of rehab or prescribed medications independently. Short: Continue to exercise regularly to support mental health and notify staff of any changes. Long: maintain mental health and well being through teaching of rehab or prescribed medications independently.      Interventions Encouraged to attend Cardiac Rehabilitation for the exercise Encouraged to attend Cardiac Rehabilitation for the exercise      Continue Psychosocial Services  Follow up required by staff Follow up required by staff             Psychosocial Discharge (Final Psychosocial Re-Evaluation):  Psychosocial Re-Evaluation - 12/17/19 1002      Psychosocial Re-Evaluation   Comments Patient reports that he continues to have no issues with current mental states, sleep, stress, depression or anxiety. Will continue to follow up with patient in a few weeks for any changes. He reports having his friends as a good support system for himself. He repors sitting and doing some deep brething exercises/meditation when he has stress. 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    Expected Outcomes Short: Continue to exercise regularly to support mental health and notify staff of any changes. Long: maintain mental health and well being through teaching of rehab or prescribed medications independently.     Interventions Encouraged to attend Cardiac Rehabilitation for the exercise    Continue Psychosocial Services  Follow up required by staff           Vocational Rehabilitation: Provide vocational rehab assistance to qualifying candidates.   Vocational Rehab Evaluation & Intervention:   Education: Education Goals: Education classes will be provided on a variety of topics geared toward better understanding of heart health and risk factor modification. Participant will state understanding/return demonstration of topics presented as noted by education test scores.  Learning Barriers/Preferences:  Learning Barriers/Preferences - 10/24/19 1039      Learning Barriers/Preferences   Learning Barriers None    Learning Preferences None           General Cardiac Education Topics:  AED/CPR: - Group verbal and written instruction with the use of models to demonstrate the basic use of the AED with the basic  ABC's of resuscitation.   Anatomy and Cardiac Procedures: - Group verbal and visual presentation and models provide information about basic cardiac anatomy and function. Reviews the testing methods done to diagnose heart disease and the outcomes of the test results. Describes the treatment choices: Medical Management, Angioplasty, or Coronary Bypass Surgery for treating various heart conditions including Myocardial Infarction, Angina, Valve Disease, and Cardiac Arrhythmias.  Written material given at graduation. Flowsheet Row Cardiac Rehab from 11/21/2019 in [facility]  Education need identified 10/28/19      Medication Safety: - Group verbal and visual instruction to review commonly prescribed medications for heart and lung disease. Reviews the medication, class of the drug, and side effects. Includes the steps to properly store meds and maintain the prescription regimen.  Written material given at graduation.   Intimacy: - Group verbal instruction through game  format to discuss how heart and lung disease can affect sexual intimacy. Written material given at graduation..   Know Your Numbers and Heart Failure: - Group verbal and visual instruction to discuss disease risk factors for cardiac and pulmonary disease and treatment options.  Reviews associated critical values for Overweight/Obesity, Hypertension, Cholesterol, and Diabetes.  Discusses basics of heart failure: signs/symptoms and treatments.  Introduces Heart Failure Zone chart for action plan for heart failure.  Written material given at graduation.   Infection Prevention: - Provides verbal and written material to individual with discussion of infection control including proper hand washing and proper equipment cleaning during exercise session. Flowsheet Row Cardiac Rehab from 11/21/2019 in [facility] Cardiac and Pulmonary Rehab  Date 10/24/19  Educator [facility]  Instruction Review Code 1- Verbalizes Understanding      Falls Prevention: - Provides verbal and written material to individual with discussion of falls prevention and safety. Flowsheet Row Cardiac Rehab from 11/21/2019 in [facility] Cardiac and Pulmonary Rehab  Date 10/24/19  Educator [facility]  Instruction Review Code 1- Verbalizes Understanding      Other: -Provides group and verbal instruction on various topics (see comments)   Knowledge Questionnaire Score:  Knowledge Questionnaire Score - 10/28/19 1352      Knowledge Questionnaire Score   Pre Score 21/26 Education Focus: Angina, Depression, Heart Failure, Nutrition, Exercise           Core Components/Risk Factors/Patient Goals at Admission:  Personal Goals and Risk Factors at Admission - 10/28/19 1352      Core Components/Risk Factors/Patient Goals on Admission    Weight Management Yes;Weight Loss    Intervention Weight Management: Develop a combined nutrition and exercise program designed to reach desired caloric intake, while maintaining appropriate intake of nutrient and fiber,  sodium and fats, and appropriate energy expenditure required for the weight goal.;Weight Management: Provide education and appropriate resources to help participant work on and attain dietary goals.;Weight Management/Obesity: Establish reasonable short term and long term weight goals.    Admit Weight 177 lb 8 oz (80.5 kg)    Goal Weight: Short Term 172 lb (78 kg)    Goal Weight: Long Term 170 lb (77.1 kg)    Expected Outcomes Short Term: Continue to assess and modify interventions until short term weight is achieved;Long Term: Adherence to nutrition and physical activity/exercise program aimed toward attainment of established weight goal;Understanding recommendations for meals to include 15-35% energy as protein, 25-35% energy from fat, 35-60% energy from carbohydrates, less than 264m of dietary cholesterol, 20-35 gm of total fiber daily;Understanding of distribution of calorie intake throughout the day with the consumption of 4-5  meals/snacks;Weight Loss: Understanding of general recommendations for a balanced deficit meal plan, which promotes 1-2 lb weight loss per week and includes a negative energy balance of [phone] kcal/d    Hypertension Yes    Intervention Provide education on lifestyle modifcations including regular physical activity/exercise, weight management, moderate sodium restriction and increased consumption of fresh fruit, vegetables, and low fat dairy, alcohol moderation, and smoking cessation.;Monitor prescription use compliance.    Expected Outcomes Short Term: Continued assessment and intervention until BP is < 140/52m HG in hypertensive participants. < 130/874mHG in hypertensive participants with diabetes, heart failure or chronic kidney disease.;Long Term: Maintenance of blood pressure at goal levels.    Lipids Yes    Intervention Provide education and support for participant on nutrition & aerobic/resistive exercise along with prescribed medications to achieve LDL <7064mHDL  >89m76m  Expected Outcomes Short Term: Participant states understanding of desired cholesterol values and is compliant with medications prescribed. Participant is following exercise prescription and nutrition guidelines.;Long Term: Cholesterol controlled with medications as prescribed, with individualized exercise RX and with personalized nutrition plan. Value goals: LDL < 70mg6mL > 40 mg.           Education:Diabetes - Individual verbal and written instruction to review signs/symptoms of diabetes, desired ranges of glucose level fasting, after meals and with exercise. Acknowledge that pre and post exercise glucose checks will be done for 3 sessions at entry of program.   Core Components/Risk Factors/Patient Goals Review:   Goals and Risk Factor Review    Row Name 11/19/19 0959 12/17/19 1006           Core Components/Risk Factors/Patient Goals Review   Personal Goals Review Weight Management/Obesity;Hypertension Weight Management/Obesity;Hypertension      Review Ron checks his blood pressure at home every few days. He knows how to take his blood pressure and has no questions on it. He wants to maintain his weight currently. Lauren reports checking his BP at home every week or so - encouraged to check BP daily. At home his BP runs around 114/62. He reports his weight has been stable around 180 pounds - today 179; he will continue with exercise and attend rehab.      Expected Outcomes Short: maintain weight and blood pressure readings. Long: have lower blood pressure readings post [facility] Short: maintain weight and blood pressure readings. Long: have lower blood pressure readings post [facility]             Core Components/Risk Factors/Patient Goals at Discharge (Final Review):   Goals and Risk Factor Review - 12/17/19 1006      Core Components/Risk Factors/Patient Goals Review   Personal Goals Review Weight Management/Obesity;Hypertension    Review Edrian reports checking his  BP at home every week or so - encouraged to check BP daily. At home his BP runs around 114/62. He reports his weight has been stable around 180 pounds - today 179; he will continue with exercise and attend rehab.    Expected Outcomes Short: maintain weight and blood pressure readings. Long: have lower blood pressure readings post HeartTrack           ITP Comments:  ITP Comments    Row Name 10/24/19 1038 10/28/19 1158 11/05/19 1056 11/06/19 0646 12/04/19 0952   ITP Comments Virtual Visit completed. Patient informed on EP and RD appointment and 6 Minute walk test. Patient also informed of patient health questionnaires on My Chart. Patient Verbalizes understanding. [facility]  10/16/2019. Completed 6MWT and gym orientation. Initial ITP created and sent for review to Dr. Emily Filbert, Medical Director. First full day of exercise!  Patient was oriented to gym and equipment including functions, settings, policies, and procedures.  Patient's individual exercise prescription and treatment plan were reviewed.  All starting workloads were established based on the results of the 6 minute walk test done at initial orientation visit.  The plan for exercise progression was also introduced and progression will be customized based on patient's performance and goals. 30 Day review completed. Medical Director ITP review done, changes made as directed, and signed approval by Medical Director. 30 Day review completed. Medical Director ITP review done, changes made as directed, and signed approval by Medical Director.   [place] Name 01/01/20 0601           ITP Comments 30 Day review completed. Medical Director ITP review done, changes made as directed, and signed approval by Medical Director.              Comments:

## 2020-01-02 ENCOUNTER — Encounter: Payer: Medicare Other | Admitting: *Deleted

## 2020-01-02 ENCOUNTER — Other Ambulatory Visit: Payer: Self-pay

## 2020-01-02 DIAGNOSIS — I214 Non-ST elevation (NSTEMI) myocardial infarction: Secondary | ICD-10-CM

## 2020-01-02 NOTE — Progress Notes (Signed)
Daily Session Note  Patient Details  Name: Sergio Grimes MRN: [id number] Date of Birth: 02/23/1928 Referring Provider:   Flowsheet Row Cardiac Rehab from 10/28/2019 in [facility] Rehab  Referring Provider Isaias Cowman MD      Encounter Date: 01/02/2020  Check In:  Session Check In - 01/02/20 1040      Check-In   Supervising physician immediately available to respond to emergencies See telemetry face sheet for immediately available ER MD    Location [facility]-Cardiac & Pulmonary Rehab    Staff Present Hope Budds RDN, Tawanna Solo, MS Exercise Physiologist;Shamicka Inga, RN, BSN, CCRP    Virtual Visit No    Medication changes reported     No    Fall or balance concerns reported    No    Warm-up and Cool-down Performed on first and last piece of equipment    Resistance Training Performed Yes    VAD Patient? No    PAD/SET Patient? No      Pain Assessment   Currently in Pain? No/denies              Social History   Tobacco Use  Smoking Status Former Smoker  . Packs/day: 1.00  . Years: 20.00  . Pack years: 20.00  . Types: Cigarettes  . Quit date: 01/24/1976  . Years since quitting: 43.9  Smokeless Tobacco Former Systems developer    Goals Met:  Independence with exercise equipment Exercise tolerated well No report of cardiac concerns or symptoms  Goals Unmet:  Not Applicable  Comments: Pt able to follow exercise prescription today without complaint.  Will continue to monitor for progression.    Dr. Emily Filbert is Medical Director for [employer] and [facility].

## 2020-01-07 ENCOUNTER — Encounter: Payer: Medicare Other | Attending: Cardiology

## 2020-01-07 DIAGNOSIS — Z955 Presence of coronary angioplasty implant and graft: Secondary | ICD-10-CM | POA: Insufficient documentation

## 2020-01-07 DIAGNOSIS — I214 Non-ST elevation (NSTEMI) myocardial infarction: Secondary | ICD-10-CM | POA: Insufficient documentation

## 2020-01-09 ENCOUNTER — Telehealth: Payer: Self-pay

## 2020-01-09 NOTE — Telephone Encounter (Signed)
LMOM

## 2020-01-16 ENCOUNTER — Telehealth: Payer: Self-pay

## 2020-01-16 DIAGNOSIS — I214 Non-ST elevation (NSTEMI) myocardial infarction: Secondary | ICD-10-CM

## 2020-01-16 NOTE — Telephone Encounter (Signed)
Patient wishes to be discharged from Cardiac Rehab at this time.

## 2020-01-16 NOTE — Progress Notes (Signed)
Cardiac Individual Treatment Plan  Patient Details  Name: BINH DOTEN MRN: [id number] Date of Birth: 27-Aug-1928 Referring Provider:   Flowsheet Row Cardiac Rehab from 10/28/2019 in [facility] Cardiac and Pulmonary Rehab  Referring Provider Isaias Cowman MD      Initial Encounter Date:  Flowsheet Row Cardiac Rehab from 10/28/2019 in [facility] Cardiac and Pulmonary Rehab  Date 10/28/19      Visit Diagnosis: NSTEMI (non-ST elevated myocardial infarction) [facility])  Patient's Home Medications on Admission:  Current Outpatient Medications:  .  aspirin EC 81 MG tablet, Take 81 mg by mouth at bedtime., Disp: , Rfl:  .  clopidogrel (PLAVIX) 75 MG tablet, Take 75 mg by mouth daily at 12 noon. , Disp: , Rfl:  .  finasteride (PROSCAR) 5 MG tablet, Take 5 mg by mouth every evening. , Disp: , Rfl:  .  fluoruracil (CARAC) 0.5 % cream, Apply 1 application topically daily as needed (rash)., Disp: , Rfl:  .  levothyroxine (SYNTHROID, LEVOTHROID) 88 MCG tablet, Take 88 mcg by mouth daily before breakfast., Disp: , Rfl:  .  lisinopril (PRINIVIL,ZESTRIL) 5 MG tablet, Take 5 mg by mouth daily at 12 noon. , Disp: , Rfl:  .  metoprolol succinate (TOPROL-XL) 25 MG 24 hr tablet, Take 25 mg by mouth daily., Disp: , Rfl:  .  Multiple Vitamin (MULTIVITAMIN WITH MINERALS) TABS tablet, Take 1 tablet by mouth daily at 12 noon., Disp: , Rfl:  .  rosuvastatin (CRESTOR) 40 MG tablet, Take 40 mg by mouth at bedtime. , Disp: , Rfl:  .  vitamin B-12 (CYANOCOBALAMIN) 500 MCG tablet, Take 500 mcg by mouth daily. , Disp: , Rfl:   Past Medical History: Past Medical History:  Diagnosis Date  . BPH (benign prostatic hyperplasia)   . Coronary artery disease   . High cholesterol   . Hypertension   . Hypothyroidism     Tobacco Use: Social History   Tobacco Use  Smoking Status Former Smoker  . Packs/day: 1.00  . Years: 20.00  . Pack years: 20.00  . Types: Cigarettes  . Quit date: 01/24/1976  . 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   Exercise Prescription Goal: Initial exercise prescription builds to 30-45 minutes a day of aerobic activity, 2-3 days per week.  Home exercise guidelines will be given to patient during program as part of exercise prescription that the participant will acknowledge.   Education: Aerobic Exercise: - Group verbal and visual presentation on the components of exercise prescription. Introduces F.I.T.T principle from ACSM for exercise prescriptions.  Reviews F.I.T.T. principles of aerobic exercise including progression. Written material given at graduation.   Education: Resistance Exercise: - Group verbal and visual presentation on the components of exercise prescription. Introduces F.I.T.T principle from ACSM for exercise prescriptions  Reviews F.I.T.T. principles of resistance exercise including progression. Written material given at graduation.    Education: Exercise & Equipment Safety: - Individual verbal instruction and demonstration of equipment use and safety with use of  the equipment. Flowsheet Row Cardiac Rehab from 11/21/2019 in [facility] Cardiac and Pulmonary Rehab  Date 10/24/19  Educator [facility]  Instruction Review Code 1- Verbalizes Understanding      Education: Exercise Physiology & General Exercise Guidelines: - Group verbal and written instruction with models to review the exercise physiology of the cardiovascular system and associated critical values. Provides general exercise guidelines with specific guidelines to those with  heart or lung disease.  Flowsheet Row Cardiac Rehab from 11/21/2019 in [facility] Cardiac and Pulmonary Rehab  Education need identified 10/28/19      Education: Flexibility, Balance, Mind/Body Relaxation: - Group verbal and visual presentation with interactive activity on the components of exercise prescription. Introduces F.I.T.T principle from ACSM for exercise prescriptions. Reviews F.I.T.T. principles of flexibility and balance exercise training including progression. Also discusses the mind body connection.  Reviews various relaxation techniques to help reduce and manage stress (i.e. Deep breathing, progressive muscle relaxation, and visualization). Balance handout provided to take home. Written material given at graduation.   Activity Barriers & Risk Stratification:  Activity Barriers & Cardiac Risk Stratification - 10/28/19 1326      Activity Barriers & Cardiac Risk Stratification   Activity Barriers Muscular Weakness;Deconditioning;Balance Concerns;Shortness of Breath;Other (comment)    Comments walks with limp on R side.  Drags right foot some    Cardiac Risk Stratification Moderate           6 Minute Walk:  6 Minute Walk    Row Name 10/28/19 1323         6 Minute Walk   Phase Initial     Distance 880 feet     Walk Time 6 minutes     # of Rest Breaks 0     MPH 1.67     METS 1.12     RPE 12     Perceived Dyspnea  3     VO2 Peak 3.91     Symptoms Yes (comment)     Comments SOB     Resting HR 59 bpm     Resting BP 106/54     Resting Oxygen Saturation  100 %     Exercise Oxygen Saturation  during 6 min walk 100 %     Max Ex. HR 85 bpm     Max Ex. BP 128/64     2 Minute Post BP 112/64            Oxygen Initial Assessment:   Oxygen Re-Evaluation:   Oxygen Discharge (Final Oxygen Re-Evaluation):   Initial Exercise Prescription:  Initial Exercise Prescription - 10/28/19 1300      Date of Initial Exercise RX and Referring Provider   Date 10/28/19     Referring Provider Paraschos, Alexander MD      Treadmill   MPH 1.5    Grade 0.5    Minutes 15    METs 2.15      NuStep   Level 1    SPM 80    Minutes 15    METs 2      T5 Nustep   Level 1    SPM 80    Minutes 15    METs 2      Biostep-RELP   Level 1    SPM 50    Minutes 15    METs 2      Prescription Details   Frequency (times per week) 2    Duration Progress to 30 minutes of continuous aerobic without signs/symptoms of physical  distress      Intensity   THRR 40-80% of Max Heartrate 88-117    Ratings of Perceived Exertion 11-13    Perceived Dyspnea 0-4      Progression   Progression Continue to progress workloads to maintain intensity without signs/symptoms of physical distress.      Resistance Training   Training Prescription Yes    Weight 3 lb    Reps 10-15           Perform Capillary Blood Glucose checks as needed.  Exercise Prescription Changes:  Exercise Prescription Changes    Row Name 10/28/19 1300 11/13/19 1500 11/25/19 1700 12/11/19 0900 12/17/19 1000     Response to Exercise   Blood Pressure (Admit) 106/54 132/62 132/58 128/64 -   Blood Pressure (Exercise) 128/64 140/60 130/58 152/60 -   Blood Pressure (Exit) 112/64 112/50 124/64 128/64 -   Heart Rate (Admit) 59 bpm 66 bpm 74 bpm 65 bpm -   Heart Rate (Exercise) 85 bpm 79 bpm 84 bpm 79 bpm -   Heart Rate (Exit) 63 bpm 69 bpm 70 bpm 63 bpm -   Oxygen Saturation (Admit) 100 % - - - -   Oxygen Saturation (Exercise) 100 % - - - -   Rating of Perceived Exertion (Exercise) 12 12 11 13  -   Perceived Dyspnea (Exercise) 3 - - - -   Symptoms none none none none -   Comments walk test results - - - -   Duration - Progress to 30 minutes of  aerobic without signs/symptoms of physical distress Progress to 30 minutes of  aerobic without signs/symptoms of physical distress Continue with 30 min of aerobic exercise without signs/symptoms of physical distress. -   Intensity - THRR unchanged THRR unchanged  THRR unchanged -     Progression   Progression - Continue to progress workloads to maintain intensity without signs/symptoms of physical distress. Continue to progress workloads to maintain intensity without signs/symptoms of physical distress. Continue to progress workloads to maintain intensity without signs/symptoms of physical distress. -   Average METs - 2.35 2.53 2.41 -     Resistance Training   Training Prescription - Yes Yes Yes -   Weight - 3 lb 3 lb 3 lb -   Reps - 10-15 10-15 10-15 -     Interval Training   Interval Training - No No No -     Recumbant Bike   Level - 1 1 1  -   Watts - 15 - 18 -   Minutes - 15 15 15  -   METs - 2.59 2.96 2.4 -     NuStep   Level - 1 - - -   Minutes - 15 - - -   METs - 2.7 - - -     Arm Ergometer   Level - 1 1 1  -   Minutes - 15 15 15  -   METs - 2.1 2.1 2 -     Biostep-RELP   Level - 1 - - -   Minutes - 15 - - -   METs - 2 - - -     Home Exercise Plan   Plans to continue exercise at - - - - Home (comment)  walk   Frequency - - - - Add 2 additional days to program exercise sessions.   Initial Home Exercises Provided - - - - 12/17/19   Row Name 12/24/19 0900 01/07/20 1600  Response to Exercise   Blood Pressure (Admit) 132/64 140/66      Blood Pressure (Exercise) 148/58 162/62      Blood Pressure (Exit) 126/58 120/58      Heart Rate (Admit) 70 bpm 69 bpm      Heart Rate (Exercise) 93 bpm 86 bpm      Heart Rate (Exit) 67 bpm 61 bpm      Rating of Perceived Exertion (Exercise) 13 13      Symptoms none none      Duration Continue with 30 min of aerobic exercise without signs/symptoms of physical distress. Continue with 30 min of aerobic exercise without signs/symptoms of physical distress.      Intensity THRR unchanged THRR unchanged             Progression   Progression Continue to progress workloads to maintain intensity without signs/symptoms of physical distress. Continue to progress workloads to maintain  intensity without signs/symptoms of physical distress.      Average METs 2.1 2.93             Resistance Training   Training Prescription Yes Yes      Weight 3 lb 3 lb      Reps 10-15 10-15             Interval Training   Interval Training No No             Treadmill   MPH 1.5 1.5      Grade 0.5 0.5      Minutes 15 15      METs 2.25 2.25             REL-XR   Level 1 1      Watts 50 -      Minutes 15 15      METs 2 3.6             Home Exercise Plan   Plans to continue exercise at - Home (comment)  walking      Frequency - Add 2 additional days to program exercise sessions.      Initial Home Exercises Provided - 12/17/19             Exercise Comments:  Exercise Comments    Row Name 11/05/19 1056           Exercise Comments First full day of exercise!  Patient was oriented to gym and equipment including functions, settings, policies, and procedures.  Patient's individual exercise prescription and treatment plan were reviewed.  All starting workloads were established based on the results of the 6 minute walk test done at initial orientation visit.  The plan for exercise progression was also introduced and progression will be customized based on patient's performance and goals.              Exercise Goals and Review:  Exercise Goals    Row Name 10/28/19 1348             Exercise Goals   Increase Physical Activity Yes       Intervention Provide advice, education, support and counseling about physical activity/exercise needs.;Develop an individualized exercise prescription for aerobic and resistive training based on initial evaluation findings, risk stratification, comorbidities and participant's personal goals.       Expected Outcomes Short Term: Attend rehab on a regular basis to increase amount of physical activity.;Long Term: Add in home exercise to make exercise part of routine and to increase amount of  physical activity.;Long Term: Exercising regularly at  least 3-5 days a week.       Increase Strength and Stamina Yes       Intervention Provide advice, education, support and counseling about physical activity/exercise needs.;Develop an individualized exercise prescription for aerobic and resistive training based on initial evaluation findings, risk stratification, comorbidities and participant's personal goals.       Expected Outcomes Short Term: Increase workloads from initial exercise prescription for resistance, speed, and METs.;Short Term: Perform resistance training exercises routinely during rehab and add in resistance training at home;Long Term: Improve cardiorespiratory fitness, muscular endurance and strength as measured by increased METs and functional capacity (6MWT)       Able to understand and use rate of perceived exertion (RPE) scale Yes       Intervention Provide education and explanation on how to use RPE scale       Expected Outcomes Long Term:  Able to use RPE to guide intensity level when exercising independently;Short Term: Able to use RPE daily in rehab to express subjective intensity level       Able to understand and use Dyspnea scale Yes       Intervention Provide education and explanation on how to use Dyspnea scale       Expected Outcomes Short Term: Able to use Dyspnea scale daily in rehab to express subjective sense of shortness of breath during exertion;Long Term: Able to use Dyspnea scale to guide intensity level when exercising independently       Knowledge and understanding of Target Heart Rate Range (THRR) Yes       Intervention Provide education and explanation of THRR including how the numbers were predicted and where they are located for reference       Expected Outcomes Short Term: Able to state/look up THRR;Short Term: Able to use daily as guideline for intensity in rehab;Long Term: Able to use THRR to govern intensity when exercising independently       Able to check pulse independently Yes       Intervention  Provide education and demonstration on how to check pulse in carotid and radial arteries.;Review the importance of being able to check your own pulse for safety during independent exercise       Expected Outcomes Short Term: Able to explain why pulse checking is important during independent exercise;Long Term: Able to check pulse independently and accurately       Understanding of Exercise Prescription Yes       Intervention Provide education, explanation, and written materials on patient's individual exercise prescription       Expected Outcomes Short Term: Able to explain program exercise prescription;Long Term: Able to explain home exercise prescription to exercise independently              Exercise Goals Re-Evaluation :  Exercise Goals Re-Evaluation    Row Name 11/05/19 1056 11/13/19 1458 11/25/19 1732 12/11/19 0944 12/17/19 0956     Exercise Goal Re-Evaluation   Exercise Goals Review Able to understand and use rate of perceived exertion (RPE) scale;Able to understand and use Dyspnea scale;Understanding of Exercise Prescription;Knowledge and understanding of Target Heart Rate Range (THRR) Increase Physical Activity;Increase Strength and Stamina;Understanding of Exercise Prescription Increase Physical Activity;Increase Strength and Stamina;Understanding of Exercise Prescription Increase Physical Activity;Increase Strength and Stamina;Understanding of Exercise Prescription Increase Physical Activity;Increase Strength and Stamina;Understanding of Exercise Prescription   Comments Reviewed RPE and dyspnea scales, THR and program prescription with pt today.  Pt voiced understanding and  was given a copy of goals to take home. Ranvir is off to a good start in rehab.  He is already at 2.1 METs on the arm crank.  We will continue to monitor his progress. Nickolas attends consistentl and works at [employer].  He works at lower end of THR range.  Staff will encourage increasing levels since he is at RPE 11.  Lakshya is doing well in rehab.  He is up to 18 watts on the bike.  We will continue to monitor his progress. He reports trying to do some exercise at home. 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 He feels that rehab is helping. EPs have not gone over home exercise yet with Czar.   Expected Outcomes Short: Use RPE daily to regulate intensity. Long: Follow program prescription in THR. Short: Continue to attend regularly Long: Continue to follow program prescription Short: increase levels on machines Long:  improve overall stamina Short: Continue to increase workloads Long; Continue to improve stamina. ST: continue to attend rehab regularly, EP to go over home exercise. LT: Exercise 150 minutes of moderate exercise per week, 2 days of weighted exercise per week.   [place] Name 12/17/19 1028 12/24/19 0947 01/07/20 1617         Exercise Goal Re-Evaluation   Exercise Goals Review - - Increase Physical Activity;Increase Strength and Stamina;Understanding of Exercise Prescription     Comments Reviewed home exercise with pt today.  Pt plans to walk for exercise.  Reviewed THR, pulse, RPE, sign and symptoms, pulse oximetery and when to call 911 or MD.  Also discussed weather considerations and indoor options.  Pt voiced understanding. Koleman reports no symptoms with exercise.  Staff will encourage increasing levels on all machines and to 4 lb for strength work. Anuar is doing well in rehab.  He is up to3.7 METs on the XR.  We will continue to montior his progress.     Expected Outcomes - Short: increase levels on machines one at a time Long: improve overall MET level Short: Increase workloads Long: Continue to improve stamina.            Discharge Exercise Prescription (Final Exercise Prescription Changes):  Exercise Prescription Changes - 01/07/20 1600      Response to Exercise   Blood Pressure (Admit) 140/66    Blood Pressure (Exercise) 162/62    Blood Pressure (Exit) 120/58    Heart Rate  (Admit) 69 bpm    Heart Rate (Exercise) 86 bpm    Heart Rate (Exit) 61 bpm    Rating of Perceived Exertion (Exercise) 13    Symptoms none    Duration Continue with 30 min of aerobic exercise without signs/symptoms of physical distress.    Intensity THRR unchanged      Progression   Progression Continue to progress workloads to maintain intensity without signs/symptoms of physical distress.    Average METs 2.93      Resistance Training   Training Prescription Yes    Weight 3 lb    Reps 10-15      Interval Training   Interval Training No      Treadmill   MPH 1.5    Grade 0.5    Minutes 15    METs 2.25      REL-XR   Level 1    Minutes 15    METs 3.6      Home Exercise Plan   Plans to continue exercise at Home (comment)  walking   Frequency Add 2 additional days to program exercise sessions.    Initial Home Exercises Provided 12/17/19           Nutrition:  Target Goals: Understanding of nutrition guidelines, daily intake of sodium '1500mg'$ , cholesterol '200mg'$ , calories 30% from fat and 7% or less from saturated fats, daily to have 5 or more servings of fruits and vegetables.  Education: All About Nutrition: -Group instruction provided by verbal, written material, interactive activities, discussions, models, and posters to present general guidelines for heart healthy nutrition including fat, fiber, MyPlate, the role of sodium in heart healthy nutrition, utilization of the nutrition label, and utilization of this knowledge for meal planning. Follow up email sent as well. Written material given at graduation. Flowsheet Row Cardiac Rehab from 11/21/2019 in [facility] Cardiac [facility] Rehab  Education need identified 10/28/19      Biometrics:  Pre Biometrics - 10/28/19 1349      Pre Biometrics   Height 5' 9.6" (1.768 m)    Weight 177 lb 8 oz (80.5 kg)    BMI (Calculated) 25.76    Single Leg Stand 1.2 seconds            Nutrition Therapy Plan and Nutrition  Goals:  Nutrition Therapy & Goals - 12/17/19 1004      Personal Nutrition Goals   Comments Rayquan reports he would like to continue with nutritoin on his own and would not like ot meet with RD. will continue to check in.           Nutrition Assessments:  Nutrition Assessments - 10/28/19 1352      MEDFICTS Scores   Pre Score 66          MEDIFICTS Score Key:  ?70 Need to make dietary changes   40-70 Heart Healthy Diet  ? 40 Therapeutic Level Cholesterol Diet   Picture Your Plate Scores:  <95 Unhealthy dietary pattern with much room for improvement.  41-50 Dietary pattern unlikely to meet recommendations for good health and room for improvement.  51-60 More healthful dietary pattern, with some room for improvement.   >60 Healthy dietary pattern, although there may be some specific behaviors that could be improved.    Nutrition Goals Re-Evaluation:  Nutrition Goals Re-Evaluation    [place] Name 11/19/19 1003 12/17/19 1005           Goals   Current Weight 171 lb (77.6 kg) -      Nutrition Goal Continue to eat Heart Healthy No goals at this time - does not want to meet with RD at this time.      Comment Patient states that he has no questions on diet and feels that he does not need to meet with the dietician. Informed patient that if he is welcome to meet with the dietician if he changes his mind. Olumide reports he would like to continue with nutritoin on his own and would not like ot meet with RD. will continue to check in.      Expected Outcome Short: maintain current diet and weight. Long: maintain a heart healthy diet No goals at this time - does not want to meet with RD at this time.             Nutrition Goals Discharge (Final Nutrition Goals Re-Evaluation):  Nutrition Goals Re-Evaluation - 12/17/19 1005      Goals   Nutrition Goal No goals at this time - does not want to meet with RD at  this time.    Comment Pepe reports he would like to continue with  nutritoin on his own and would not like ot meet with RD. will continue to check in.    Expected Outcome No goals at this time - does not want to meet with RD at this time.           Psychosocial: Target Goals: Acknowledge presence or absence of significant depression and/or stress, maximize coping skills, provide positive support system. Participant is able to verbalize types and ability to use techniques and skills needed for reducing stress and depression.   Education: Stress, Anxiety, and Depression - Group verbal and visual presentation to define topics covered.  Reviews how body is impacted by stress, anxiety, and depression.  Also discusses healthy ways to reduce stress and to treat/manage anxiety and depression.  Written material given at graduation. Flowsheet Row Cardiac Rehab from 11/21/2019 in [facility] Cardiac and Pulmonary Rehab  Education need identified 10/28/19      Education: Sleep Hygiene -Provides group verbal and written instruction about how sleep can affect your health.  Define sleep hygiene, discuss sleep cycles and impact of sleep habits. Review good sleep hygiene tips.    Initial Review & Psychosocial Screening:  Initial Psych Review & Screening - 10/24/19 1039      Initial Review   Current issues with None Identified      Family Dynamics   Good Support System? Yes    Comments He can look to his wife, son and daughter for support. He has a positve outlook on his health.      Barriers   Psychosocial barriers to participate in program The patient should benefit from training in stress management and relaxation.;There are no identifiable barriers or psychosocial needs.      Screening Interventions   Interventions To provide support and resources with identified psychosocial needs;Provide feedback about the scores to participant;Encouraged to exercise    Expected Outcomes Short Term goal: Utilizing psychosocial counselor, staff and physician to assist with  identification of specific Stressors or current issues interfering with healing process. Setting desired goal for each stressor or current issue identified.;Long Term Goal: Stressors or current issues are controlled or eliminated.;Short Term goal: Identification and review with participant of any Quality of Life or Depression concerns found by scoring the questionnaire.;Long Term goal: The participant improves quality of Life and PHQ9 Scores as seen by post scores and/or verbalization of changes           Quality of Life Scores:   Quality of Life - 10/28/19 1349      Quality of Life   Select Quality of Life      Quality of Life Scores   Health/Function Pre 24.7 %    Socioeconomic Pre 27.21 %    Psych/Spiritual Pre 29.14 %    Family Pre 28.8 %    GLOBAL Pre 26.74 %          Scores of 19 and below usually indicate a poorer quality of life in these areas.  A difference of  2-3 points is a clinically meaningful difference.  A difference of 2-3 points in the total score of the Quality of Life Index has been associated with significant improvement in overall quality of life, self-image, physical symptoms, and general health in studies assessing change in quality of life.  PHQ-9: Recent Review Flowsheet Data    Depression screen [facility] 2/9 10/28/2019   Decreased Interest 1   Down, Depressed, Hopeless 0  PHQ - 2 Score 1   Altered sleeping 0   Tired, decreased energy 2   Change in appetite 1   Feeling bad or failure about yourself  0   Trouble concentrating 0   Moving slowly or fidgety/restless 0   Suicidal thoughts 0   PHQ-9 Score 4   Difficult doing work/chores Not difficult at all     Interpretation of Total Score  Total Score Depression Severity:  1-4 = Minimal depression, 5-9 = Mild depression, 10-14 = Moderate depression, 15-19 = Moderately severe depression, 20-27 = Severe depression   Psychosocial Evaluation and Intervention:  Psychosocial Evaluation - 10/24/19 1040       Psychosocial Evaluation & Interventions   Interventions Encouraged to exercise with the program and follow exercise prescription;Relaxation education;Stress management education    Comments He can look to his wife, son and daughter for support. He has a positve outlook on his health.    Expected Outcomes Short: Exercise regularly to support mental health and notify staff of any changes. Long: maintain mental health and well being through teaching of rehab or prescribed medications independently.    Continue Psychosocial Services  Follow up required by staff           Psychosocial Re-Evaluation:  Psychosocial Re-Evaluation    [place] Name 11/19/19 1002 12/17/19 1002           Psychosocial Re-Evaluation   Current issues with None Identified -      Comments Patient reports no issues with their current mental states, sleep, stress, depression or anxiety. Will follow up with patient in a few weeks for any changes. Patient reports that he continues to have no issues with current mental states, sleep, stress, depression or anxiety. Will continue to follow up with patient in a few weeks for any changes. He reports having his friends as a good support system for himself. He repors sitting and doing some deep brething exercises/meditation when he has stress. 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      Expected Outcomes Short: Continue to exercise regularly to support mental health and notify staff of any changes. Long: maintain mental health and well being through teaching of rehab or prescribed medications independently. Short: Continue to exercise regularly to support mental health and notify staff of any changes. Long: maintain mental health and well being through teaching of rehab or prescribed medications independently.      Interventions Encouraged to attend Cardiac Rehabilitation for the exercise Encouraged to attend Cardiac Rehabilitation for the exercise      Continue  Psychosocial Services  Follow up required by staff Follow up required by staff             Psychosocial Discharge (Final Psychosocial Re-Evaluation):  Psychosocial Re-Evaluation - 12/17/19 1002      Psychosocial Re-Evaluation   Comments Patient reports that he continues to have no issues with current mental states, sleep, stress, depression or anxiety. Will continue to follow up with patient in a few weeks for any changes. He reports having his friends as a good support system for himself. He repors sitting and doing some deep brething exercises/meditation when he has stress. 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    Expected Outcomes Short: Continue to exercise regularly to support mental health and notify staff of any changes. Long: maintain mental health and well being through teaching of rehab or prescribed medications independently.  Interventions Encouraged to attend Cardiac Rehabilitation for the exercise    Continue Psychosocial Services  Follow up required by staff           Vocational Rehabilitation: Provide vocational rehab assistance to qualifying candidates.   Vocational Rehab Evaluation & Intervention:   Education: Education Goals: Education classes will be provided on a variety of topics geared toward better understanding of heart health and risk factor modification. Participant will state understanding/return demonstration of topics presented as noted by education test scores.  Learning Barriers/Preferences:  Learning Barriers/Preferences - 10/24/19 1039      Learning Barriers/Preferences   Learning Barriers None    Learning Preferences None           General Cardiac Education Topics:  AED/CPR: - Group verbal and written instruction with the use of models to demonstrate the basic use of the AED with the basic ABC's of resuscitation.   Anatomy and Cardiac Procedures: - Group verbal and visual presentation and models  provide information about basic cardiac anatomy and function. Reviews the testing methods done to diagnose heart disease and the outcomes of the test results. Describes the treatment choices: Medical Management, Angioplasty, or Coronary Bypass Surgery for treating various heart conditions including Myocardial Infarction, Angina, Valve Disease, and Cardiac Arrhythmias.  Written material given at graduation. Flowsheet Row Cardiac Rehab from 11/21/2019 in [facility] Cardiac and Pulmonary Rehab  Education need identified 10/28/19      Medication Safety: - Group verbal and visual instruction to review commonly prescribed medications for heart and lung disease. Reviews the medication, class of the drug, and side effects. Includes the steps to properly store meds and maintain the prescription regimen.  Written material given at graduation.   Intimacy: - Group verbal instruction through game format to discuss how heart and lung disease can affect sexual intimacy. Written material given at graduation..   Know Your Numbers and Heart Failure: - Group verbal and visual instruction to discuss disease risk factors for cardiac and pulmonary disease and treatment options.  Reviews associated critical values for Overweight/Obesity, Hypertension, Cholesterol, and Diabetes.  Discusses basics of heart failure: signs/symptoms and treatments.  Introduces Heart Failure Zone chart for action plan for heart failure.  Written material given at graduation.   Infection Prevention: - Provides verbal and written material to individual with discussion of infection control including proper hand washing and proper equipment cleaning during exercise session. Flowsheet Row Cardiac Rehab from 11/21/2019 in [facility] Cardiac and Pulmonary Rehab  Date 10/24/19  Educator [facility]  Instruction Review Code 1- Verbalizes Understanding      Falls Prevention: - Provides verbal and written material to individual with discussion of falls prevention  and safety. Flowsheet Row Cardiac Rehab from 11/21/2019 in [facility] Cardiac and Pulmonary Rehab  Date 10/24/19  Educator [facility]  Instruction Review Code 1- Verbalizes Understanding      Other: -Provides group and verbal instruction on various topics (see comments)   Knowledge Questionnaire Score:  Knowledge Questionnaire Score - 10/28/19 1352      Knowledge Questionnaire Score   Pre Score 21/26 Education Focus: Angina, Depression, Heart Failure, Nutrition, Exercise           Core Components/Risk Factors/Patient Goals at Admission:  Personal Goals and Risk Factors at Admission - 10/28/19 1352      Core Components/Risk Factors/Patient Goals on Admission    Weight Management Yes;Weight Loss    Intervention Weight Management: Develop a combined nutrition and exercise program designed to reach desired caloric intake, while maintaining  appropriate intake of nutrient and fiber, sodium and fats, and appropriate energy expenditure required for the weight goal.;Weight Management: Provide education and appropriate resources to help participant work on and attain dietary goals.;Weight Management/Obesity: Establish reasonable short term and long term weight goals.    Admit Weight 177 lb 8 oz (80.5 kg)    Goal Weight: Short Term 172 lb (78 kg)    Goal Weight: Long Term 170 lb (77.1 kg)    Expected Outcomes Short Term: Continue to assess and modify interventions until short term weight is achieved;Long Term: Adherence to nutrition and physical activity/exercise program aimed toward attainment of established weight goal;Understanding recommendations for meals to include 15-35% energy as protein, 25-35% energy from fat, 35-60% energy from carbohydrates, less than $RemoveB'200mg'qpJOvUWj$  of dietary cholesterol, 20-35 gm of total fiber daily;Understanding of distribution of calorie intake throughout the day with the consumption of 4-5 meals/snacks;Weight Loss: Understanding of general recommendations for a balanced deficit meal  plan, which promotes 1-2 lb weight loss per week and includes a negative energy balance of [phone] kcal/d    Hypertension Yes    Intervention Provide education on lifestyle modifcations including regular physical activity/exercise, weight management, moderate sodium restriction and increased consumption of fresh fruit, vegetables, and low fat dairy, alcohol moderation, and smoking cessation.;Monitor prescription use compliance.    Expected Outcomes Short Term: Continued assessment and intervention until BP is < 140/55mm HG in hypertensive participants. < 130/62mm HG in hypertensive participants with diabetes, heart failure or chronic kidney disease.;Long Term: Maintenance of blood pressure at goal levels.    Lipids Yes    Intervention Provide education and support for participant on nutrition & aerobic/resistive exercise along with prescribed medications to achieve LDL '70mg'$ , HDL >$Remo'40mg'FdwVA$ .    Expected Outcomes Short Term: Participant states understanding of desired cholesterol values and is compliant with medications prescribed. Participant is following exercise prescription and nutrition guidelines.;Long Term: Cholesterol controlled with medications as prescribed, with individualized exercise RX and with personalized nutrition plan. Value goals: LDL < $Rem'70mg'wdoy$ , HDL > 40 mg.           Education:Diabetes - Individual verbal and written instruction to review signs/symptoms of diabetes, desired ranges of glucose level fasting, after meals and with exercise. Acknowledge that pre and post exercise glucose checks will be done for 3 sessions at entry of program.   Core Components/Risk Factors/Patient Goals Review:   Goals and Risk Factor Review    Row Name 11/19/19 0959 12/17/19 1006           Core Components/Risk Factors/Patient Goals Review   Personal Goals Review Weight Management/Obesity;Hypertension Weight Management/Obesity;Hypertension      Review Ron checks his blood pressure at home every few  days. He knows how to take his blood pressure and has no questions on it. He wants to maintain his weight currently. Jerick reports checking his BP at home every week or so - encouraged to check BP daily. At home his BP runs around 114/62. He reports his weight has been stable around 180 pounds - today 179; he will continue with exercise and attend rehab.      Expected Outcomes Short: maintain weight and blood pressure readings. Long: have lower blood pressure readings post HeartTrack Short: maintain weight and blood pressure readings. Long: have lower blood pressure readings post HeartTrack             Core Components/Risk Factors/Patient Goals at Discharge (Final Review):   Goals and Risk Factor Review - 12/17/19 1006  Core Components/Risk Factors/Patient Goals Review   Personal Goals Review Weight Management/Obesity;Hypertension    Review Demarrion reports checking his BP at home every week or so - encouraged to check BP daily. At home his BP runs around 114/62. He reports his weight has been stable around 180 pounds - today 179; he will continue with exercise and attend rehab.    Expected Outcomes Short: maintain weight and blood pressure readings. Long: have lower blood pressure readings post HeartTrack           ITP Comments:  ITP Comments    Row Name 10/24/19 1038 10/28/19 1158 11/05/19 1056 11/06/19 0646 12/04/19 0952   ITP Comments Virtual Visit completed. Patient informed on EP and RD appointment and 6 Minute walk test. Patient also informed of patient health questionnaires on My Chart. Patient Verbalizes understanding. Visit diagnosis can be found in [facility] 10/16/2019. Completed 6MWT and gym orientation. Initial ITP created and sent for review to Dr. Emily Filbert, Medical Director. First full day of exercise!  Patient was oriented to gym and equipment including functions, settings, policies, and procedures.  Patient's individual exercise prescription and treatment plan were reviewed.   All starting workloads were established based on the results of the 6 minute walk test done at initial orientation visit.  The plan for exercise progression was also introduced and progression will be customized based on patient's performance and goals. 30 Day review completed. Medical Director ITP review done, changes made as directed, and signed approval by Medical Director. 30 Day review completed. Medical Director ITP review done, changes made as directed, and signed approval by Medical Director.   [place] Name 01/01/20 0601 01/16/20 1139         ITP Comments 30 Day review completed. Medical Director ITP review done, changes made as directed, and signed approval by Medical Director. Patient wishes to be dischared early at this time.             Comments: Discharge ITP

## 2020-01-16 NOTE — Progress Notes (Signed)
Discharge Progress Report  Patient Details  Name: Sergio Grimes MRN: [id number] Date of Birth: Jul 05, 1928 Referring Provider:   Flowsheet Row Cardiac Rehab from 10/28/2019 in [facility] Cardiac and Pulmonary Rehab  Referring Provider Marcina Millard MD       Number of Visits: 16  Reason for Discharge:  Early Exit:  Personal   Diagnosis:  NSTEMI (non-ST elevated myocardial infarction) [name])  ADL UCSD:   Initial Exercise Prescription:  Initial Exercise Prescription - 10/28/19 1300      Date of Initial Exercise RX and Referring Provider   Date 10/28/19    Referring Provider Marcina Millard MD      Treadmill   MPH 1.5    Grade 0.5    Minutes 15    METs 2.15      NuStep   Level 1    SPM 80    Minutes 15    METs 2      T5 Nustep   Level 1    SPM 80    Minutes 15    METs 2      Biostep-RELP   Level 1    SPM 50    Minutes 15    METs 2      Prescription Details   Frequency (times per week) 2    Duration Progress to 30 minutes of continuous aerobic without signs/symptoms of physical distress      Intensity   THRR 40-80% of Max Heartrate 88-117    Ratings of Perceived Exertion 11-13    Perceived Dyspnea 0-4      Progression   Progression Continue to progress workloads to maintain intensity without signs/symptoms of physical distress.      Resistance Training   Training Prescription Yes    Weight 3 lb    Reps 10-15           Discharge Exercise Prescription (Final Exercise Prescription Changes):  Exercise Prescription Changes - 01/07/20 1600      Response to Exercise   Blood Pressure (Admit) 140/66    Blood Pressure (Exercise) 162/62    Blood Pressure (Exit) 120/58    Heart Rate (Admit) 69 bpm    Heart Rate (Exercise) 86 bpm    Heart Rate (Exit) 61 bpm    Rating of Perceived Exertion (Exercise) 13    Symptoms none    Duration Continue with 30 min of aerobic exercise without signs/symptoms of physical distress.    Intensity THRR unchanged       Progression   Progression Continue to progress workloads to maintain intensity without signs/symptoms of physical distress.    Average METs 2.93      Resistance Training   Training Prescription Yes    Weight 3 lb    Reps 10-15      Interval Training   Interval Training No      Treadmill   MPH 1.5    Grade 0.5    Minutes 15    METs 2.25      REL-XR   Level 1    Minutes 15    METs 3.6      Home Exercise Plan   Plans to continue exercise at Home (comment)   walking   Frequency Add 2 additional days to program exercise sessions.    Initial Home Exercises Provided 12/17/19           Functional Capacity:  6 Minute Walk    Row Name 10/28/19 1323  6 Minute Walk   Phase Initial     Distance 880 feet     Walk Time 6 minutes     # of Rest Breaks 0     MPH 1.67     METS 1.12     RPE 12     Perceived Dyspnea  3     VO2 Peak 3.91     Symptoms Yes (comment)     Comments SOB     Resting HR 59 bpm     Resting BP 106/54     Resting Oxygen Saturation  100 %     Exercise Oxygen Saturation  during 6 min walk 100 %     Max Ex. HR 85 bpm     Max Ex. BP 128/64     2 Minute Post BP 112/64             Quality of Life:  Quality of Life - 10/28/19 1349      Quality of Life   Select Quality of Life      Quality of Life Scores   Health/Function Pre 24.7 %    Socioeconomic Pre 27.21 %    Psych/Spiritual Pre 29.14 %    Family Pre 28.8 %    GLOBAL Pre 26.74 %           Nutrition & Weight - Outcomes:  Pre Biometrics - 10/28/19 1349      Pre Biometrics   Height 5' 9.6" (1.768 m)    Weight 177 lb 8 oz (80.5 kg)    BMI (Calculated) 25.76    Single Leg Stand 1.2 seconds            Nutrition:  Nutrition Therapy & Goals - 12/17/19 1004      Personal Nutrition Goals   Comments Sergio Grimes reports he would like to continue with nutritoin on his own and would not like ot meet with RD. will continue to check in.           Nutrition Discharge:   Nutrition Assessments - 10/28/19 1352      MEDFICTS Scores   Pre Score 66            Goals reviewed with patient; copy given to patient.

## 2022-06-03 IMAGING — CR DG CHEST 2V
2 series · 2 of 2 positions shown · non-contrast
Comparison: None.

CLINICAL DATA: Chest pain, shortness of breath

EXAM:
CHEST - 2 VIEW

[chest pa]
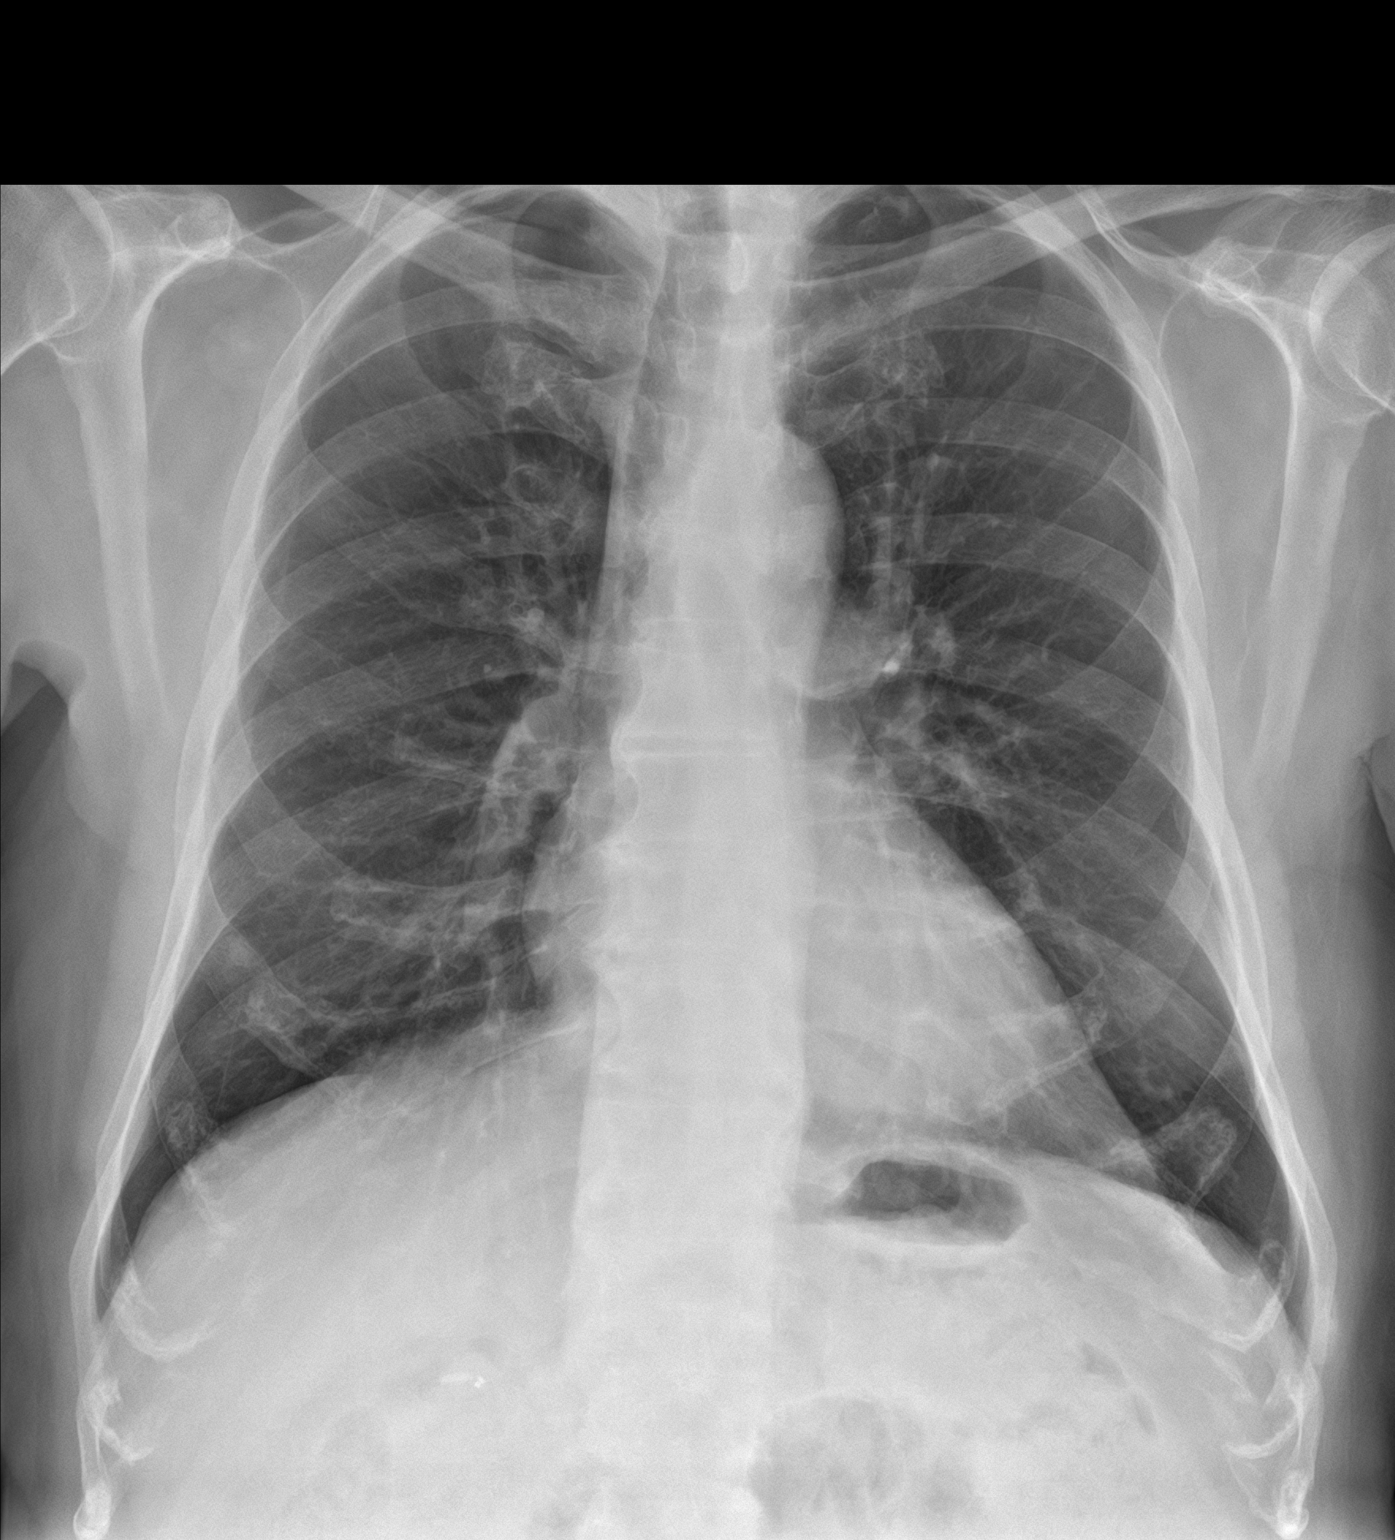

[chest lat]
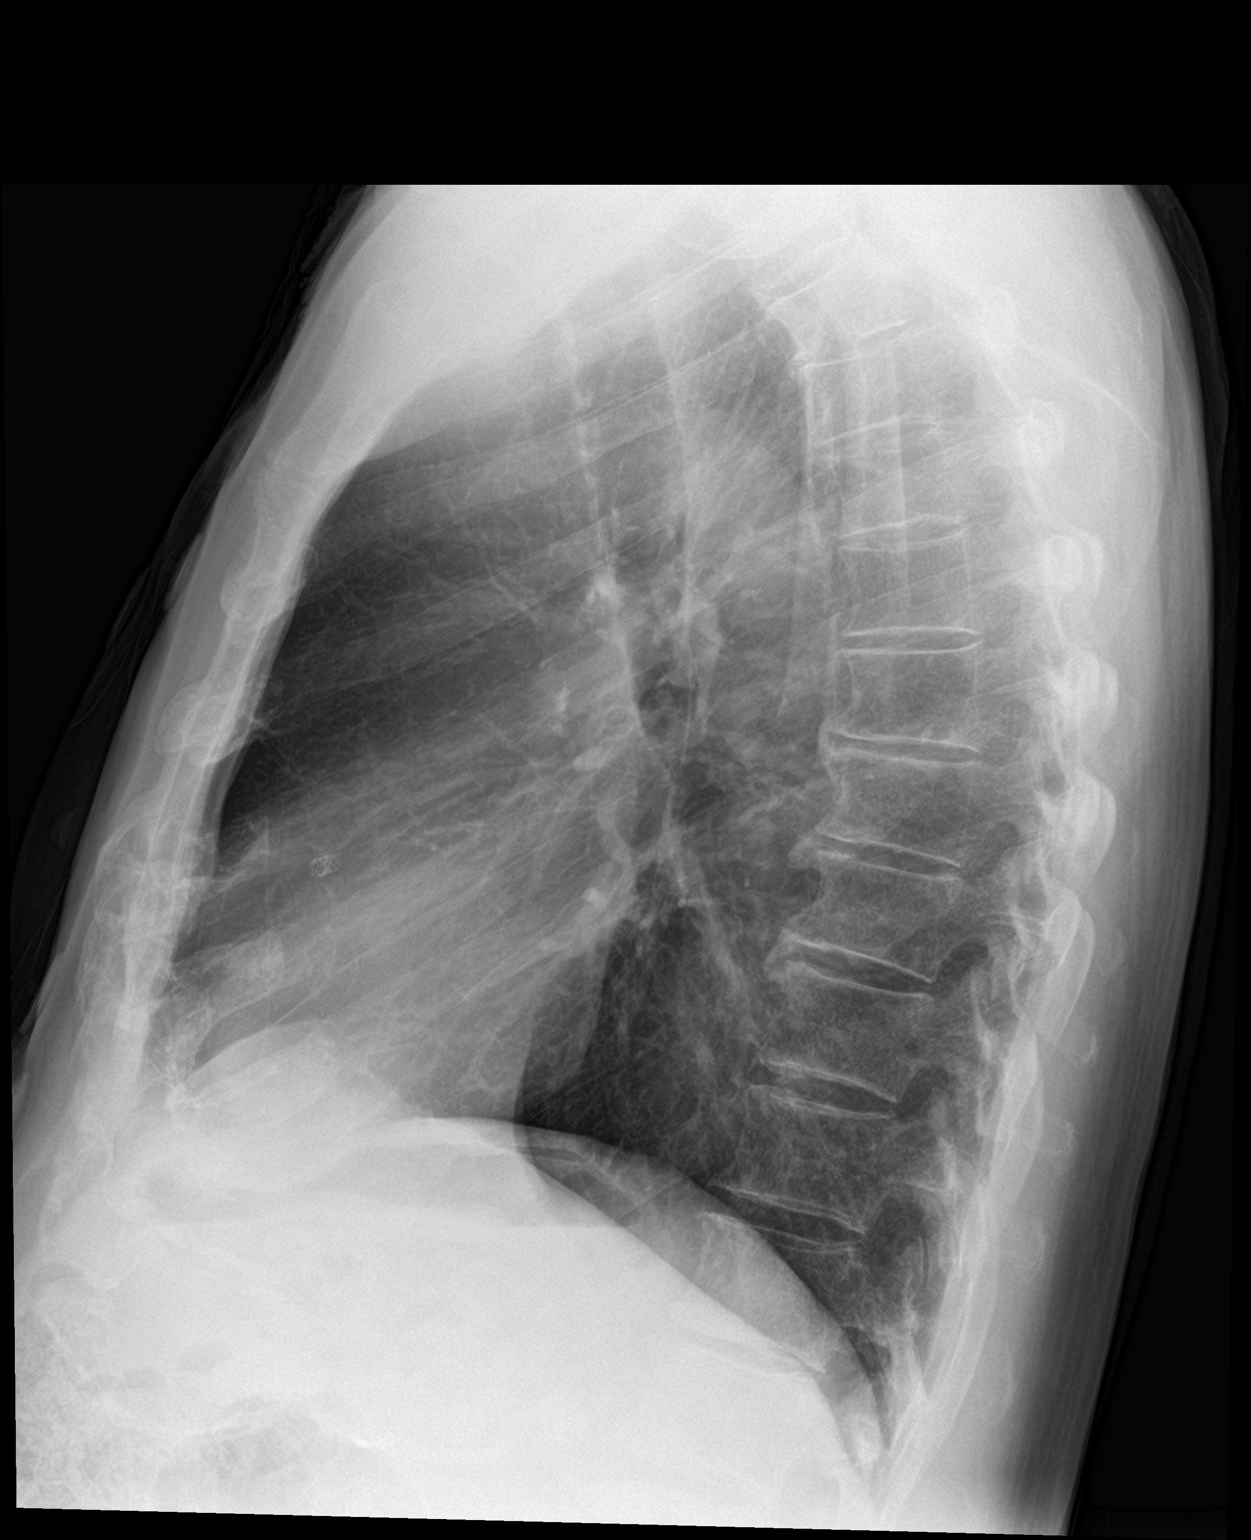

[2 of 2 positions shown; findings below may reference images not displayed]

FINDINGS: Heart and mediastinal contours are within normal limits. No focal
opacities or effusions. No acute bony abnormality. Thoracic
spondylosis.
IMPRESSION: No active cardiopulmonary disease.

## 2022-06-24 IMAGING — CR DG CHEST 2V
1 series · 2 of 2 positions shown · non-contrast
Comparison: 09/25/2019

CLINICAL DATA: Chest pain

EXAM:
CHEST - 2 VIEW

[Series 1: dg chest 2 view · 0.14mm/px · 2 of 2 slices shown]
[im 1/2]
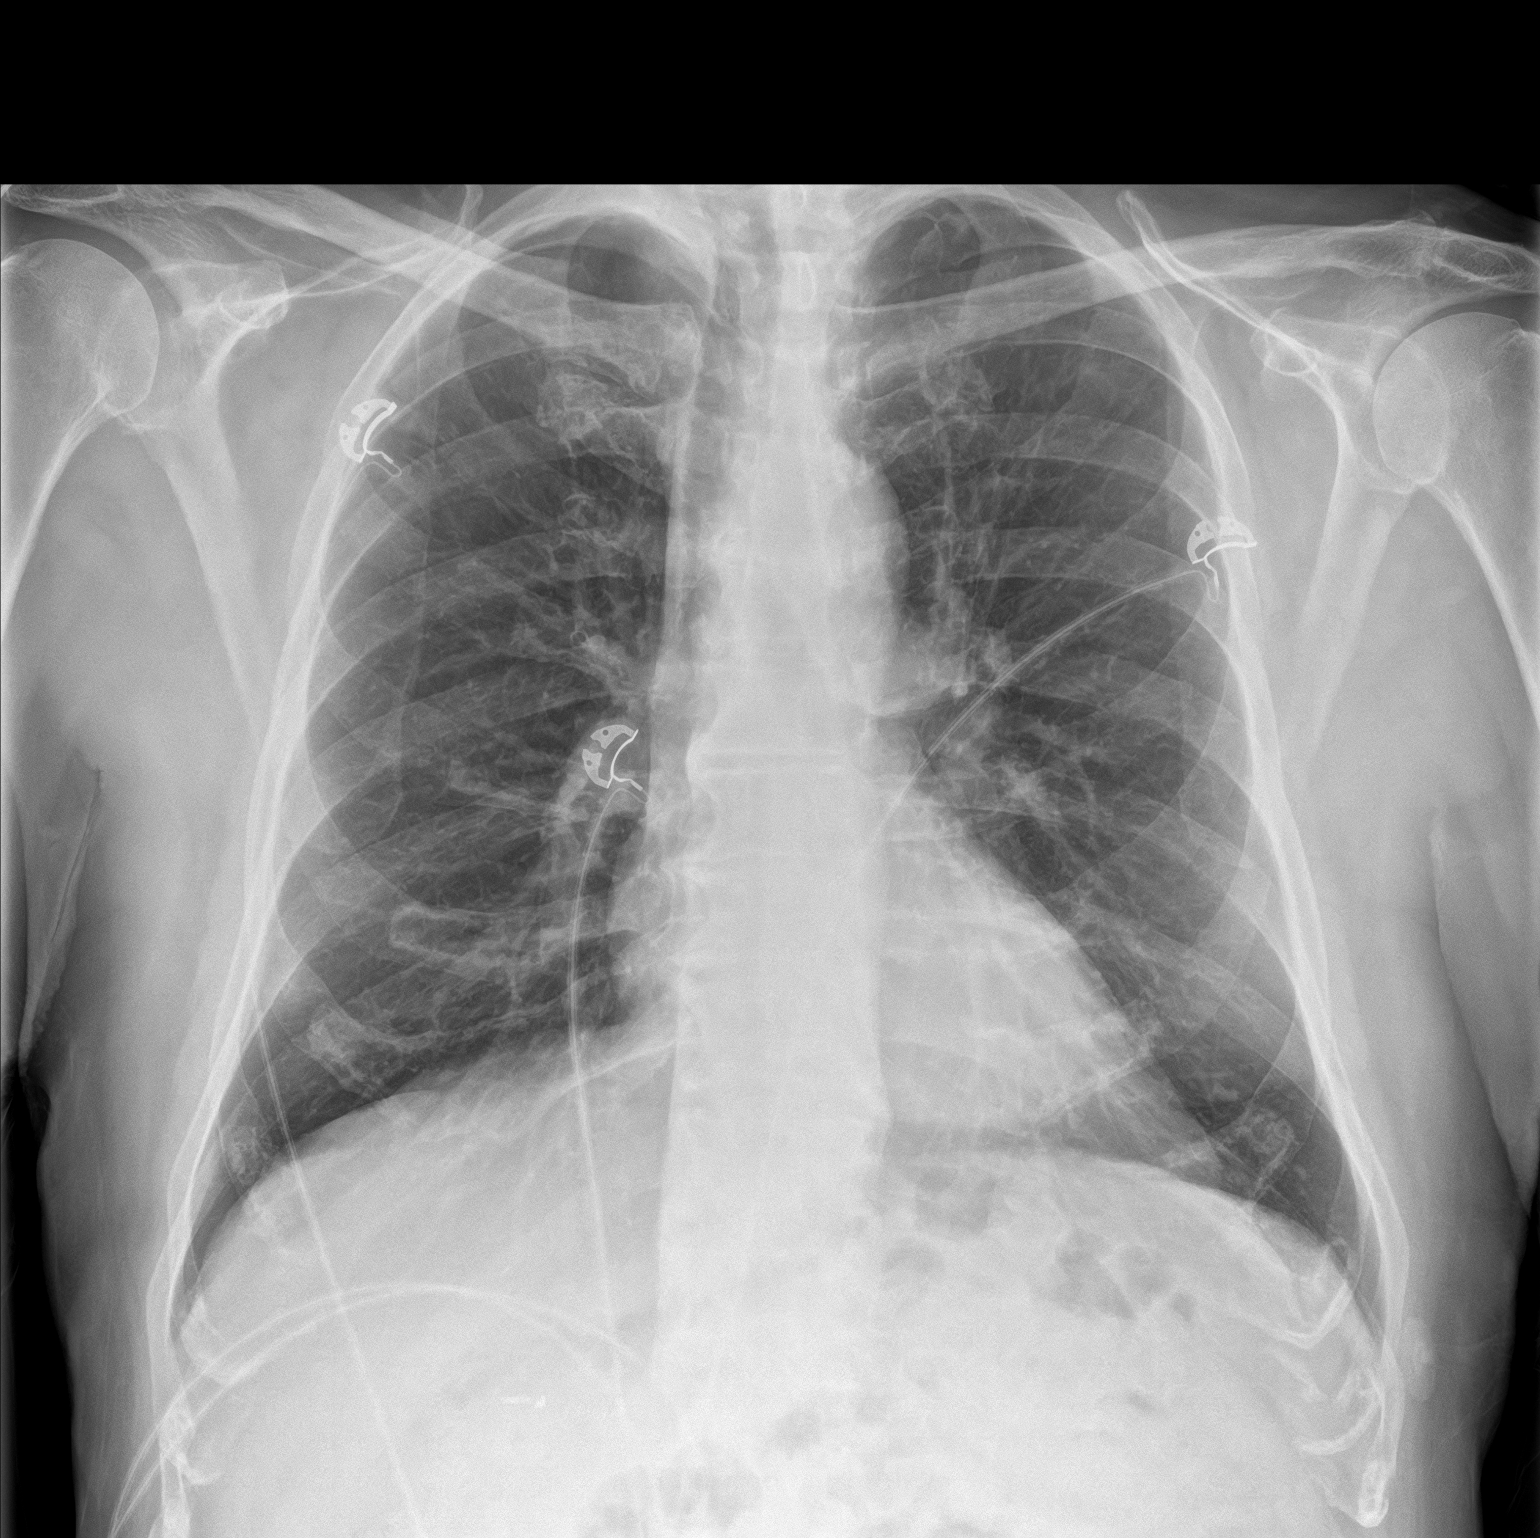
[im 2/2]
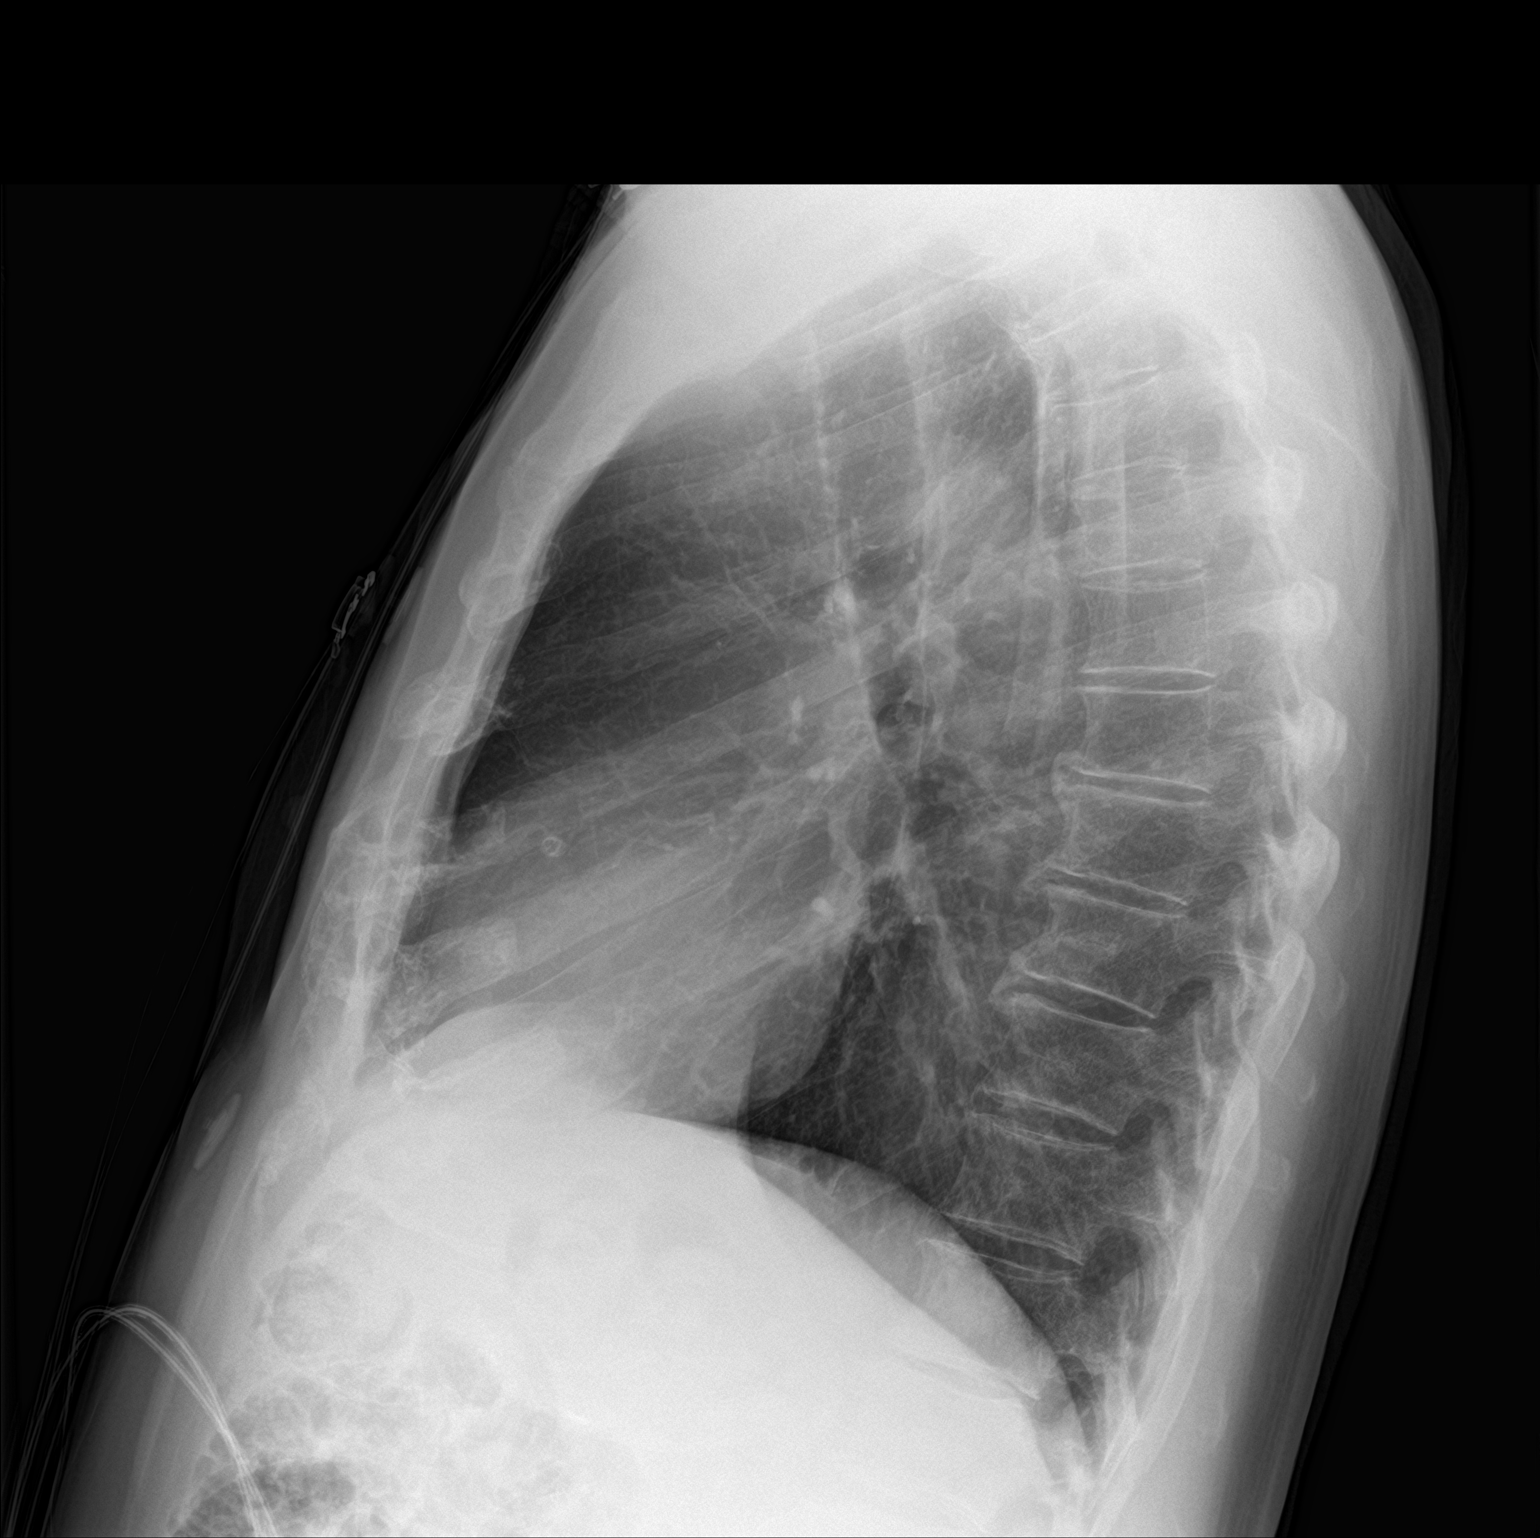

[2 of 2 positions shown; findings below may reference images not displayed]

FINDINGS: No new consolidation or edema. No pleural effusion or pneumothorax.
Stable cardiomediastinal contours with normal heart size. No acute
osseous abnormality.
IMPRESSION: No acute process in the chest.
